# Patient Record
Sex: Female | Born: 1955 | Race: White | Hispanic: No | Marital: Single | Smoking: Never smoker
Health system: Southern US, Community
[De-identification: ages and names within clinical notes are randomized; demographics above are authoritative.]

## PROBLEM LIST (undated history)

## (undated) DIAGNOSIS — I10 Essential (primary) hypertension: Secondary | ICD-10-CM

## (undated) DIAGNOSIS — G5600 Carpal tunnel syndrome, unspecified upper limb: Secondary | ICD-10-CM

## (undated) DIAGNOSIS — Z8489 Family history of other specified conditions: Secondary | ICD-10-CM

## (undated) DIAGNOSIS — F32A Depression, unspecified: Secondary | ICD-10-CM

## (undated) DIAGNOSIS — F419 Anxiety disorder, unspecified: Secondary | ICD-10-CM

## (undated) DIAGNOSIS — Z9889 Other specified postprocedural states: Secondary | ICD-10-CM

## (undated) DIAGNOSIS — C73 Malignant neoplasm of thyroid gland: Secondary | ICD-10-CM

## (undated) DIAGNOSIS — M1990 Unspecified osteoarthritis, unspecified site: Secondary | ICD-10-CM

## (undated) DIAGNOSIS — F329 Major depressive disorder, single episode, unspecified: Secondary | ICD-10-CM

## (undated) DIAGNOSIS — I219 Acute myocardial infarction, unspecified: Secondary | ICD-10-CM

## (undated) DIAGNOSIS — T4145XA Adverse effect of unspecified anesthetic, initial encounter: Secondary | ICD-10-CM

## (undated) DIAGNOSIS — I2510 Atherosclerotic heart disease of native coronary artery without angina pectoris: Secondary | ICD-10-CM

## (undated) DIAGNOSIS — R350 Frequency of micturition: Secondary | ICD-10-CM

## (undated) DIAGNOSIS — E785 Hyperlipidemia, unspecified: Secondary | ICD-10-CM

## (undated) DIAGNOSIS — R112 Nausea with vomiting, unspecified: Secondary | ICD-10-CM

## (undated) DIAGNOSIS — T8859XA Other complications of anesthesia, initial encounter: Secondary | ICD-10-CM

## (undated) DIAGNOSIS — E039 Hypothyroidism, unspecified: Secondary | ICD-10-CM

## (undated) DIAGNOSIS — K219 Gastro-esophageal reflux disease without esophagitis: Secondary | ICD-10-CM

## (undated) DIAGNOSIS — E119 Type 2 diabetes mellitus without complications: Secondary | ICD-10-CM

## (undated) HISTORY — PX: TOTAL ABDOMINAL HYSTERECTOMY: SHX209

## (undated) HISTORY — PX: JOINT REPLACEMENT: SHX530

## (undated) HISTORY — DX: Acute myocardial infarction, unspecified: I21.9

## (undated) HISTORY — DX: Carpal tunnel syndrome, unspecified upper limb: G56.00

## (undated) HISTORY — DX: Major depressive disorder, single episode, unspecified: F32.9

## (undated) HISTORY — DX: Depression, unspecified: F32.A

## (undated) HISTORY — DX: Frequency of micturition: R35.0

## (undated) HISTORY — DX: Essential (primary) hypertension: I10

## (undated) HISTORY — PX: APPENDECTOMY: SHX54

## (undated) HISTORY — DX: Hypothyroidism, unspecified: E03.9

## (undated) HISTORY — DX: Malignant neoplasm of thyroid gland: C73

## (undated) HISTORY — PX: OTHER SURGICAL HISTORY: SHX169

## (undated) HISTORY — DX: Hyperlipidemia, unspecified: E78.5

## (undated) HISTORY — PX: COLONOSCOPY: SHX174

## (undated) HISTORY — DX: Anxiety disorder, unspecified: F41.9

## (undated) HISTORY — PX: LAPAROSCOPIC APPENDECTOMY: SUR753

---

## 2001-03-18 ENCOUNTER — Encounter: Admission: RE | Admit: 2001-03-18 | Discharge: 2001-06-16 | Payer: Self-pay | Admitting: Internal Medicine

## 2001-09-21 ENCOUNTER — Encounter: Payer: Self-pay | Admitting: Internal Medicine

## 2001-09-21 ENCOUNTER — Ambulatory Visit (HOSPITAL_COMMUNITY): Admission: RE | Admit: 2001-09-21 | Discharge: 2001-09-21 | Payer: Self-pay | Admitting: Internal Medicine

## 2001-11-23 ENCOUNTER — Other Ambulatory Visit: Admission: RE | Admit: 2001-11-23 | Discharge: 2001-11-23 | Payer: Self-pay | Admitting: Internal Medicine

## 2002-10-11 ENCOUNTER — Encounter: Payer: Self-pay | Admitting: Internal Medicine

## 2002-10-11 ENCOUNTER — Ambulatory Visit (HOSPITAL_COMMUNITY): Admission: RE | Admit: 2002-10-11 | Discharge: 2002-10-11 | Payer: Self-pay | Admitting: Internal Medicine

## 2003-01-30 ENCOUNTER — Encounter: Admission: RE | Admit: 2003-01-30 | Discharge: 2003-04-30 | Payer: Self-pay | Admitting: Family Medicine

## 2003-05-30 ENCOUNTER — Encounter: Admission: RE | Admit: 2003-05-30 | Discharge: 2003-08-28 | Payer: Self-pay | Admitting: Family Medicine

## 2003-06-30 ENCOUNTER — Ambulatory Visit (HOSPITAL_COMMUNITY): Admission: RE | Admit: 2003-06-30 | Discharge: 2003-06-30 | Payer: Self-pay | Admitting: Family Medicine

## 2003-06-30 ENCOUNTER — Encounter: Payer: Self-pay | Admitting: Family Medicine

## 2003-07-18 ENCOUNTER — Inpatient Hospital Stay (HOSPITAL_COMMUNITY): Admission: RE | Admit: 2003-07-18 | Discharge: 2003-07-20 | Payer: Self-pay | Admitting: Obstetrics and Gynecology

## 2003-07-18 ENCOUNTER — Encounter (INDEPENDENT_AMBULATORY_CARE_PROVIDER_SITE_OTHER): Payer: Self-pay | Admitting: *Deleted

## 2003-10-25 ENCOUNTER — Ambulatory Visit (HOSPITAL_COMMUNITY): Admission: RE | Admit: 2003-10-25 | Discharge: 2003-10-25 | Payer: Self-pay | Admitting: Family Medicine

## 2004-10-25 ENCOUNTER — Encounter: Admission: RE | Admit: 2004-10-25 | Discharge: 2004-10-25 | Payer: Self-pay | Admitting: Family Medicine

## 2004-11-14 ENCOUNTER — Ambulatory Visit (HOSPITAL_COMMUNITY): Admission: RE | Admit: 2004-11-14 | Discharge: 2004-11-14 | Payer: Self-pay | Admitting: Family Medicine

## 2005-01-09 ENCOUNTER — Observation Stay (HOSPITAL_COMMUNITY): Admission: EM | Admit: 2005-01-09 | Discharge: 2005-01-10 | Payer: Self-pay | Admitting: Emergency Medicine

## 2005-01-09 ENCOUNTER — Encounter (INDEPENDENT_AMBULATORY_CARE_PROVIDER_SITE_OTHER): Payer: Self-pay | Admitting: Specialist

## 2005-03-14 ENCOUNTER — Encounter: Admission: RE | Admit: 2005-03-14 | Discharge: 2005-03-14 | Payer: Self-pay | Admitting: Family Medicine

## 2005-04-21 ENCOUNTER — Emergency Department (HOSPITAL_COMMUNITY): Admission: EM | Admit: 2005-04-21 | Discharge: 2005-04-21 | Payer: Self-pay | Admitting: Emergency Medicine

## 2005-07-16 ENCOUNTER — Encounter: Admission: RE | Admit: 2005-07-16 | Discharge: 2005-07-16 | Payer: Self-pay | Admitting: Family Medicine

## 2005-07-17 ENCOUNTER — Encounter: Admission: RE | Admit: 2005-07-17 | Discharge: 2005-07-17 | Payer: Self-pay | Admitting: Family Medicine

## 2005-07-22 ENCOUNTER — Ambulatory Visit (HOSPITAL_COMMUNITY): Admission: RE | Admit: 2005-07-22 | Discharge: 2005-07-22 | Payer: Self-pay | Admitting: Orthopaedic Surgery

## 2005-12-11 ENCOUNTER — Ambulatory Visit (HOSPITAL_COMMUNITY): Admission: RE | Admit: 2005-12-11 | Discharge: 2005-12-11 | Payer: Self-pay | Admitting: Family Medicine

## 2006-12-22 ENCOUNTER — Ambulatory Visit (HOSPITAL_COMMUNITY): Admission: RE | Admit: 2006-12-22 | Discharge: 2006-12-22 | Payer: Self-pay | Admitting: Family Medicine

## 2007-01-06 ENCOUNTER — Encounter: Admission: RE | Admit: 2007-01-06 | Discharge: 2007-01-06 | Payer: Self-pay | Admitting: Family Medicine
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
## 2007-04-22 ENCOUNTER — Ambulatory Visit (HOSPITAL_COMMUNITY): Admission: RE | Admit: 2007-04-22 | Discharge: 2007-04-23 | Payer: Self-pay | Admitting: General Surgery

## 2007-04-22 ENCOUNTER — Encounter (INDEPENDENT_AMBULATORY_CARE_PROVIDER_SITE_OTHER): Payer: Self-pay | Admitting: General Surgery

## 2007-04-22 HISTORY — PX: LAPAROSCOPIC CHOLECYSTECTOMY: SUR755

## 2008-04-04 ENCOUNTER — Encounter: Admission: RE | Admit: 2008-04-04 | Discharge: 2008-04-04 | Payer: Self-pay | Admitting: Family Medicine

## 2009-09-30 ENCOUNTER — Observation Stay (HOSPITAL_COMMUNITY): Admission: EM | Admit: 2009-09-30 | Discharge: 2009-10-02 | Payer: Self-pay | Admitting: Emergency Medicine

## 2009-10-01 ENCOUNTER — Encounter (INDEPENDENT_AMBULATORY_CARE_PROVIDER_SITE_OTHER): Payer: Self-pay | Admitting: Internal Medicine

## 2009-12-25 ENCOUNTER — Encounter: Admission: RE | Admit: 2009-12-25 | Discharge: 2009-12-25 | Payer: Self-pay | Admitting: Family Medicine

## 2010-02-08 ENCOUNTER — Encounter (INDEPENDENT_AMBULATORY_CARE_PROVIDER_SITE_OTHER): Payer: Self-pay | Admitting: General Surgery

## 2010-02-08 ENCOUNTER — Observation Stay (HOSPITAL_COMMUNITY): Admission: RE | Admit: 2010-02-08 | Discharge: 2010-02-09 | Payer: Self-pay | Admitting: General Surgery

## 2010-03-12 ENCOUNTER — Encounter (HOSPITAL_COMMUNITY): Admission: RE | Admit: 2010-03-12 | Discharge: 2010-05-10 | Payer: Self-pay | Admitting: Endocrinology

## 2010-04-02 ENCOUNTER — Encounter: Admission: RE | Admit: 2010-04-02 | Discharge: 2010-04-02 | Payer: Self-pay | Admitting: Endocrinology

## 2010-04-17 ENCOUNTER — Encounter: Admission: RE | Admit: 2010-04-17 | Discharge: 2010-04-17 | Payer: Self-pay | Admitting: Family Medicine

## 2010-07-08 ENCOUNTER — Encounter: Admission: RE | Admit: 2010-07-08 | Discharge: 2010-07-08 | Payer: Self-pay | Admitting: Family Medicine

## 2010-12-01 ENCOUNTER — Encounter: Payer: Self-pay | Admitting: Family Medicine

## 2010-12-01 ENCOUNTER — Encounter: Payer: Self-pay | Admitting: General Surgery

## 2011-01-25 ENCOUNTER — Emergency Department (HOSPITAL_COMMUNITY)
Admission: EM | Admit: 2011-01-25 | Discharge: 2011-01-25 | Disposition: A | Discharge status: Home / Self Care | Payer: BC Managed Care – PPO | Attending: Emergency Medicine | Admitting: Emergency Medicine

## 2011-01-25 ENCOUNTER — Emergency Department (HOSPITAL_COMMUNITY): Payer: BC Managed Care – PPO

## 2011-01-25 DIAGNOSIS — K219 Gastro-esophageal reflux disease without esophagitis: Secondary | ICD-10-CM | POA: Insufficient documentation

## 2011-01-25 DIAGNOSIS — F341 Dysthymic disorder: Secondary | ICD-10-CM | POA: Insufficient documentation

## 2011-01-25 DIAGNOSIS — E785 Hyperlipidemia, unspecified: Secondary | ICD-10-CM | POA: Insufficient documentation

## 2011-01-25 DIAGNOSIS — Z79899 Other long term (current) drug therapy: Secondary | ICD-10-CM | POA: Insufficient documentation

## 2011-01-25 DIAGNOSIS — E119 Type 2 diabetes mellitus without complications: Secondary | ICD-10-CM | POA: Insufficient documentation

## 2011-01-25 DIAGNOSIS — I10 Essential (primary) hypertension: Secondary | ICD-10-CM | POA: Insufficient documentation

## 2011-01-25 DIAGNOSIS — E039 Hypothyroidism, unspecified: Secondary | ICD-10-CM | POA: Insufficient documentation

## 2011-01-25 DIAGNOSIS — R079 Chest pain, unspecified: Secondary | ICD-10-CM | POA: Insufficient documentation

## 2011-01-25 LAB — POCT CARDIAC MARKERS: Troponin i, poc: <0.05 ng/mL (ref 0.00–0.09)

## 2011-01-25 LAB — POCT I-STAT, CHEM 8
BUN: 15 mg/dL (ref 6–23)
Potassium: 4.1 mEq/L (ref 3.5–5.1)

## 2011-01-29 LAB — GLUCOSE, CAPILLARY
Glucose-Capillary: 159 mg/dL — ABNORMAL HIGH (ref 70–99)
Glucose-Capillary: 166 mg/dL — ABNORMAL HIGH (ref 70–99)

## 2011-02-02 LAB — BASIC METABOLIC PANEL
BUN: 21 mg/dL (ref 6–23)
CO2: 31 mEq/L (ref 19–32)
Calcium: 9.8 mg/dL (ref 8.4–10.5)
Creatinine, Ser: 0.81 mg/dL (ref 0.4–1.2)
Glucose, Bld: 92 mg/dL (ref 70–99)
Sodium: 136 mEq/L (ref 135–145)

## 2011-02-02 LAB — DIFFERENTIAL
Basophils Absolute: 0.1 10*3/uL (ref 0.0–0.1)
Basophils Relative: 1 % (ref 0–1)
Eosinophils Relative: 6 % — ABNORMAL HIGH (ref 0–5)
Monocytes Absolute: 0.5 10*3/uL (ref 0.1–1.0)
Neutro Abs: 3.1 10*3/uL (ref 1.7–7.7)

## 2011-02-02 LAB — CBC
Hemoglobin: 14.6 g/dL (ref 12.0–15.0)
MCHC: 34.6 g/dL (ref 30.0–36.0)
RDW: 12.4 % (ref 11.5–15.5)

## 2011-02-04 ENCOUNTER — Other Ambulatory Visit (HOSPITAL_COMMUNITY): Payer: Self-pay | Admitting: Endocrinology

## 2011-02-04 DIAGNOSIS — C73 Malignant neoplasm of thyroid gland: Secondary | ICD-10-CM

## 2011-02-12 LAB — BASIC METABOLIC PANEL
CO2: 27 mEq/L (ref 19–32)
Chloride: 102 mEq/L (ref 96–112)
Chloride: 109 mEq/L (ref 96–112)
GFR calc Af Amer: >60 mL/min (ref >60)
GFR calc Af Amer: >60 mL/min (ref >60)
Potassium: 4.2 mEq/L (ref 3.5–5.1)
Sodium: 137 mEq/L (ref 135–145)

## 2011-02-12 LAB — CBC
HCT: 42.3 % (ref 36.0–46.0)
Hemoglobin: 14.5 g/dL (ref 12.0–15.0)
MCHC: 34.2 g/dL (ref 30.0–36.0)
MCHC: 34.7 g/dL (ref 30.0–36.0)
MCV: 90.3 fL (ref 78.0–100.0)
MCV: 90.5 fL (ref 78.0–100.0)
Platelets: 373 10*3/uL (ref 150–400)
RBC: 4.42 MIL/uL (ref 3.87–5.11)
RBC: 4.67 MIL/uL (ref 3.87–5.11)
RBC: 4.7 MIL/uL (ref 3.87–5.11)
WBC: 7.4 10*3/uL (ref 4.0–10.5)
WBC: 8.7 10*3/uL (ref 4.0–10.5)

## 2011-02-12 LAB — LIPID PANEL
HDL: 46 mg/dL (ref >39)
Total CHOL/HDL Ratio: 4.7 RATIO
Triglycerides: 345 mg/dL — ABNORMAL HIGH (ref <150)
VLDL: 69 mg/dL — ABNORMAL HIGH (ref 0–40)

## 2011-02-12 LAB — URINALYSIS, ROUTINE W REFLEX MICROSCOPIC
Glucose, UA: NEGATIVE mg/dL
Hgb urine dipstick: NEGATIVE
Ketones, ur: NEGATIVE mg/dL
Protein, ur: NEGATIVE mg/dL

## 2011-02-12 LAB — PROTIME-INR
INR: 0.93 (ref 0.00–1.49)
Prothrombin Time: 12.4 seconds (ref 11.6–15.2)

## 2011-02-12 LAB — CK TOTAL AND CKMB (NOT AT ARMC)
CK, MB: 3 ng/mL (ref 0.3–4.0)
CK, MB: 3.5 ng/mL (ref 0.3–4.0)
Relative Index: 1.7 (ref 0.0–2.5)
Relative Index: 2 (ref 0.0–2.5)

## 2011-02-12 LAB — DIFFERENTIAL
Basophils Absolute: 0.1 10*3/uL (ref 0.0–0.1)
Lymphocytes Relative: 29 % (ref 12–46)
Monocytes Absolute: 1 10*3/uL (ref 0.1–1.0)
Monocytes Relative: 11 % (ref 3–12)
Neutro Abs: 5.6 10*3/uL (ref 1.7–7.7)

## 2011-02-12 LAB — COMPREHENSIVE METABOLIC PANEL
ALT: 43 U/L — ABNORMAL HIGH (ref 0–35)
AST: 39 U/L — ABNORMAL HIGH (ref 0–37)
Albumin: 4.1 g/dL (ref 3.5–5.2)
CO2: 25 mEq/L (ref 19–32)
Calcium: 9 mg/dL (ref 8.4–10.5)
GFR calc Af Amer: >60 mL/min (ref >60)
GFR calc non Af Amer: >60 mL/min (ref >60)
Sodium: 138 mEq/L (ref 135–145)
Total Protein: 6.7 g/dL (ref 6.0–8.3)

## 2011-02-12 LAB — GLUCOSE, CAPILLARY: Glucose-Capillary: 102 mg/dL — ABNORMAL HIGH (ref 70–99)

## 2011-02-12 LAB — MAGNESIUM: Magnesium: 2.3 mg/dL (ref 1.5–2.5)

## 2011-02-24 ENCOUNTER — Encounter (HOSPITAL_COMMUNITY)
Admission: RE | Admit: 2011-02-24 | Discharge: 2011-02-24 | Disposition: A | Discharge status: Home / Self Care | Payer: BC Managed Care – PPO | Source: Ambulatory Visit | Attending: Endocrinology | Admitting: Endocrinology

## 2011-02-24 DIAGNOSIS — Z09 Encounter for follow-up examination after completed treatment for conditions other than malignant neoplasm: Secondary | ICD-10-CM | POA: Insufficient documentation

## 2011-02-24 DIAGNOSIS — C73 Malignant neoplasm of thyroid gland: Secondary | ICD-10-CM | POA: Insufficient documentation

## 2011-02-25 ENCOUNTER — Encounter (HOSPITAL_COMMUNITY)
Admission: RE | Admit: 2011-02-25 | Discharge: 2011-02-25 | Disposition: A | Discharge status: Home / Self Care | Payer: BC Managed Care – PPO | Source: Ambulatory Visit | Attending: Endocrinology | Admitting: Endocrinology

## 2011-02-26 ENCOUNTER — Encounter (HOSPITAL_COMMUNITY)
Admission: RE | Admit: 2011-02-26 | Discharge: 2011-02-26 | Disposition: A | Discharge status: Home / Self Care | Payer: BC Managed Care – PPO | Source: Ambulatory Visit | Attending: Endocrinology | Admitting: Endocrinology

## 2011-02-28 ENCOUNTER — Ambulatory Visit (HOSPITAL_COMMUNITY)
Admission: RE | Admit: 2011-02-28 | Discharge: 2011-02-28 | Disposition: A | Discharge status: Home / Self Care | Payer: BC Managed Care – PPO | Source: Ambulatory Visit | Attending: Endocrinology | Admitting: Endocrinology

## 2011-02-28 DIAGNOSIS — C73 Malignant neoplasm of thyroid gland: Secondary | ICD-10-CM | POA: Insufficient documentation

## 2011-02-28 DIAGNOSIS — Z09 Encounter for follow-up examination after completed treatment for conditions other than malignant neoplasm: Secondary | ICD-10-CM | POA: Insufficient documentation

## 2011-02-28 MED ORDER — SODIUM IODIDE I 131 CAPSULE
4.0000 | Freq: Once | INTRAVENOUS | Status: AC | PRN
  Administered 2011-02-28: 4 via ORAL

## 2011-03-25 NOTE — Op Note (Signed)
Natalie, Hensley                ACCOUNT NO.:  [id number]   MEDICAL RECORD NO.:  [id number]          PATIENT TYPE:  OIB   LOCATION:  5705                         FACILITY:  [facility]   PHYSICIAN:  Sharlet Salina T. Hoxworth, M.D.DATE OF BIRTH:  [birth year]   DATE OF PROCEDURE:  04/22/2007  DATE OF DISCHARGE:  04/23/2007                               OPERATIVE REPORT   POSTOPERATIVE DIAGNOSIS:  Cholelithiasis and cholecystitis.   SURGICAL PROCEDURES:  Laparoscopic cholecystectomy with intraoperative  cholangiogram.   SURGEON:  Sharlet Salina T. Hoxworth, M.D.   ANESTHESIA:  General.   BRIEF HISTORY:  Natalie Hensley is a 55 year old female with daily  persistent episodic epigastric and right upper quadrant abdominal pain.  Workup has included a gallbladder ultrasound showing a large gallstone.  She is felt to have symptomatic cholelithiasis and cholecystitis.  Laparoscopic cholecystectomy with cholangiogram has been  recommended  and accepted.  The nature of procedure, its indications and risks of  bleeding, infection, bile leak and bile duct injury have been discussed  understood.  She is now brought to operating room for this procedure.   DESCRIPTION OF OPERATION:  The patient brought up and placed in supine  position on the table and general orotracheal anesthesia was induced.  The abdomen was widely sterilely prepped and draped.  Correct patient  procedure were verified.  PAS were place.  Local anesthesia was used  infiltrate the trocar sites.  A 1 cm incision was made at the umbilicus  and dissection carried down to midline fascia which was sharply incised  for 1 cm, the peritoneum entered under direct vision.  Through mattress  suture of 0-0 Vicryl the Hasson trocar was placed and pneumoperitoneum  established.  Under direct vision a standard four-port technique was  used.  There were fairly extensive omental adhesions up to the  gallbladder.  It was not acutely inflamed.  These adhesions  were taken  down with careful cautery and blunt dissection.  There was significant  adhesions of the duodenum up to the infundibulum as well but these were  filmy and taken down, staying away from the duodenum.  The infundibulum  was then retracted inferolaterally.  Peritoneum anterior and posterior  to Calot's triangle was incised.  Fibrofatty tissue was stripped down  off the neck of the gallbladder toward the porta hepatis.  The distal  gallbladder in Calot's triangle was thoroughly dissected.  The cystic  artery clearly identified coursing up on the gallbladder wall.  The  cystic duct was identified and dissected free and the cystic duct  gallbladder junction dissected 360 degrees.  When the anatomy was clear,  the cystic artery was doubly clipped proximally, clipped distally and  the cystic duct was clipped to the gallbladder junction.  Operative  cholangiogram was then obtained through the cystic duct which showed  good filling of normal common bile duct and intrahepatic ducts with free  flow into the duodenum and no filling defects.  Following this, the  cholangiocath was removed.  The cystic duct was doubly clipped  proximally and divided.  Cystic artery was divided.  A gallbladder was  then dissected free from its bed using hook cautery and removed intact  through the umbilicus.  Complete hemostasis was obtained in the  gallbladder bed.  The right upper quadrant was  thoroughly irrigated and suctioned.  Trocars removed with direct vision  and all CO2 evacuated.  The mattress suture was secured at the  umbilicus.  Skin incisions were closed with subcuticular 4-0 Monocryl  and Dermabond.  Sponge, needle and instrument counts were correct.  The  patient taken recovery in good condition.      Lorne Skeens. Hoxworth, M.D.  Electronically Signed     BTH/MEDQ  D:  04/22/2007  T:  04/23/2007  Job:  [id number]

## 2011-03-28 NOTE — H&P (Signed)
NAMEHERO, MCCATHERN                ACCOUNT NO.:  [id number]   MEDICAL RECORD NO.:  [id number]          PATIENT TYPE:  INP   LOCATION:  0102                         FACILITY:  [facility]   PHYSICIAN:  Anselm Pancoast. Weatherly, M.D.DATE OF BIRTH:  Jan 28, 1956   DATE OF ADMISSION:  01/09/2005  DATE OF DISCHARGE:                                HISTORY & PHYSICAL   CHIEF COMPLAINT:  Abdominal pain, shifting to the right lower quadrant.   HISTORY:  Kindred Heying is a 55 year old Caucasian female who was referred to  the ER to see me by the [facility], where she had  presented this morning with the following history:  She said yesterday, she  felt gaseous, bloated.  The pain was kind of more in the upper abdomen.  Then early this morning, shifted to the right upper quadrant.  She was seen  in the [employer].  White count was 14,000 with definite  tenderness in her lower abdomen.  Referred over for a CT at [facility]  that showed findings of early acute appendicitis.   The patient has had a previous hysterectomy and a bilateral salpingo-  oophorectomy and otherwise is in good health.   She is on several medications, one for blood pressure, Cardizem 240 XL.  She  is also on Zocor, Laroxyl, Prilosec, Ecotrin, and vitamins.   Denies allergies.   PHYSICAL EXAMINATION:  VITAL SIGNS:  Temperature 97.6, blood pressure  119/77, pulse 95, respirations 20.  I am not sure where her weight is  recorded.  GENERAL:  She is a pleasant, slightly overweight Caucasian female in no  acute distress.  She appears younger than her stated age.  HEENT:  Adequately hydrated.  NECK:  No cervical or supraclavicular adenopathy.  LUNGS:  Clear.  HEART:  Normal sinus rhythm.  BREASTS:  Not done.  ABDOMEN:  She is mildly bloated.  No umbilical or inguinal hernias.  Definitely mildly tender in the right lower quadrant with mild muscle  guarding.  Not tender in the upper abdomen or  left lower quadrant.  Did not  do a pelvic exam, since she has had a previous hysterectomy, and CT confirms  appendicitis.  EXTREMITIES:  Unremarkable.  CNS:  Physiologic.   IMPRESSION:  Acute appendicitis with a history of hypertension.   PLAN:  We will proceed with a laparoscopic cholecystectomy.  She will be  given 3 gm of Unasyn and hopefully will be able to be released in  approximately 24 hours.      WJW/MEDQ  D:  01/09/2005  T:  01/09/2005  Job:  [id number]

## 2011-03-28 NOTE — Op Note (Signed)
Natalie Hensley, Natalie Hensley                ACCOUNT NO.:  [id number]   MEDICAL RECORD NO.:  [id number]          PATIENT TYPE:  INP   LOCATION:  0102                         [facility]ACILITY:  [facility]   PHYSICIAN:  Anselm Pancoast. Weatherly, M.D.DATE OF BIRTH:  06/08/1956   DATE OF PROCEDURE:  01/09/2005  DATE OF DISCHARGE:                                 OPERATIVE REPORT   PREOPERATIVE DIAGNOSES:  Acute appendicitis.   POSTOPERATIVE DIAGNOSES:  Acute appendicitis.   OPERATION:  Laparoscopic appendectomy.   ANESTHESIA:  General.   SURGEON:  Anselm Pancoast. Zachery Dakins, M.D.   ASSISTANT:  Nurse.   HISTORY:  Natalie Hensley is a 55 year old Caucasian female who was referred by  the [facility] where she presented this morning with  approximately 24 hour progressive history of kind of generalized and  localized pain in the right lower quadrant. A CT was performed that  mentioned it was thought to be acute appendicitis and she had an elevated  white count of 14,000. The patient was definitely tender in the lower  abdomen and I think with her symptoms it certainly sounds like appendicitis  and the appendix was larger than thought normal but no evidence of any  periappendiceal fluid or preparation. The patient was given 3 g of Unasyn,  he has had a short delay in getting to the operating room.   The patient was positioned on the OR table, induction of general anesthesia,  the endotracheal tube, oral tube into the stomach. The abdomen was then  prepped with Betadine surgical solution and draped in a sterile manner. A  Foley catheter had been inserted and then I made a small incision below the  umbilicus, the fascia was identified and was picked up between Kocher's. A  small opening carefully made into the peritoneal cavity.  #0 Vicryl was  placed and Hasson cannula introduced.  Carbon dioxide was started, a camera  was inserted and you could see in the right upper quadrant what appeared to  be  a large early acute appendicitis.  A 5 mm trocar was placed in the right  upper quadrant and then a 10/11 placed in the left lower quadrant. She had  had a previous hysterectomy and there were some adhesions that I divided  with electrocautery exposing the appendix better. There were some adhesions  around the appendix that were separated down to the lateral pelvic wall and  then the appendiceal mesentery could be separated at the base of the  appendix. This was fired with the first vascular GIA and then the second  fire was placed around the base of the appendix and both fired with good  hemostasis. I then placed the appendix in the EndoCatch bag and switched the  camera back to the left lower quadrant and withdrew the bag containing the  appendix at the umbilicus. I then reinserted the Hasson cannula, thoroughly  irrigated, looked at all the operative areas, good hemostasis, there was a  little bit of bleeding where the adhesions had been taken down against the  right lateral pelvic wall and this was electrocoagulated.  Irrigation was  used and satisfied the irrigation fluid had been aspirated and then the  10/11 in the left lower quadrant was first removed under direct vision, 5 mm  under direct vision and then Hasson cannula. I had placed a couple of figure-  of-eights at the umbilicus of #0 Vicryl in addition to the pursestring and I  placed one  figure-of-eight into the anterior fascia with __________.  The subcutaneous  wounds were closed with 4-0 Vicryl, Benzoin and Steri-Strips were placed on  the skin.  The patient tolerated the procedure nicely and was sent to the  recovery room, extubated. We removed her Foley catheter and hopefully she  will be ready for discharge in the a.m.      WJW/MEDQ  D:  01/09/2005  T:  01/09/2005  Job:  [id number]   cc:   Quinn Plowman, PA  [facility]

## 2011-03-28 NOTE — Op Note (Signed)
NAMEVIBHA, Natalie Hensley                          ACCOUNT NO.:  [id number]   MEDICAL RECORD NO.:  [id number]                   PATIENT TYPE:  INP   LOCATION:  9198                                 FACILITY:  [facility]   PHYSICIAN:  Laqueta Linden, M.D.                 DATE OF BIRTH:  October 01, 1956   DATE OF PROCEDURE:  07/18/2003  DATE OF DISCHARGE:                                 OPERATIVE REPORT   PREOPERATIVE DIAGNOSIS:  Leiomyoma uteri.   POSTOPERATIVE DIAGNOSIS:  Leiomyoma uteri.   PROCEDURE:  Total abdominal hysterectomy with bilateral salpingo-  oophorectomy, placement of On-Q system x2.   SURGEON:  Laqueta Linden, M.D.   ASSISTANT:  Andres Ege, M.D.   ANESTHESIA:  General endotracheal.   ESTIMATED BLOOD LOSS:  100 mL.   URINE OUTPUT:  Greater than 250 mL of clear urine.   FLUIDS:  1400 mL of crystalloid.   COUNTS:  Correct x2.   COMPLICATIONS:  None.   INDICATIONS:  Natalie Hensley is a 55 year old gravida 1, para 0 single black  female presenting with perimenopausal dysfunctional bleeding and large  fibroids with pressure related symptoms including pelvic pressure, urinary  frequency, and constipation.  She was noted on ultrasound to have multiple  large fibroids, the largest measuring 10 x 9.5 cm.  She underwent  endometrial sampling revealing focal simple hyperplasia without atypia.  She  was assessed of the risks, benefits and alternatives of the procedure  including the possibility of awaiting natural menopause or shrinkage or  uterine artery embolization and desired to proceed to definitive surgical  management.  She desired removal of both ovaries and understands that she  will become immediately menopausal.  She has a Vivelle 0.05 mg patch in  place to control these symptoms postoperatively.  She and her partner have  seen the informed consent film and voice their understanding of all risks,  benefits and alternatives and complications including, but not limited  to,  anesthesia risks, infection, bleeding possibly requiring transfusion, injury  to bowel, bladder, ureters as well as vessels or nerves, possibility of  fistula formation, other postoperative complications including an increased  risk of infection or slow healing due to the patient's diet-controlled  diabetes, risks related to her hypertension, as well as risks of DVT, PE,  pneumonia, death and other unnamed risks.  She has seen the informed consent  film, voiced her understanding and acceptance of all risks and agrees to  proceed.  She is also aware that this will render her permanently and  irreversibly sterilized as well.  The patient received Ancef 1 g IV  antibiotic prophylaxis on call to the OR.   DESCRIPTION OF PROCEDURE:  The patient was taken to the operating room.  After proper identification and consent were ascertained, she was placed on  the operating table in the supine position.  After the induction of general  endotracheal anesthesia, she was placed in the frog-leg position and the  abdomen, perineum and vagina were prepped and draped in a routine sterile  fashion.  A transurethral Foley was placed.  The Pfannenstiel incision was  then made and carried down to the level of the anterior rectus fascia.  The  fascia was incised and the incision extended laterally, superiorly and  inferiorly.  The rectus muscles were separated and the parietal peritoneum  elevated and incised.  The incision was extended superiorly and inferiorly  to the level of the bladder.  Palpation of the upper abdomen revealed smooth  renal contours bilaterally, smooth liver edge with no palpable gallstones.  The appendix was normal in appearance and to palpation.  The self-retaining  retractor was placed, and the bowel packed into the upper abdomen using  moistened lap packs.  The lap pads were also used to cushion the lateral  blades of the retractor.  Inspection of the pelvis revealed a grossly   enlarged, distorted uterus with multiple large vascular-appearing fibroid  knobs protruding from all directions of the uterine fundus.  Both tubes and  ovaries appeared normal, if not somewhat atrophic in appearance.  There was  some scarring of the bladder up onto the anterior uterine surface, almost up  to the fundus.  It was felt that the patient likely had endometriosis as  well.  Several filmy adhesions were lysed, and the uterus was elevated in  the operative field.  The adnexal pedicles and cornual region were grasped  with [facility] clamps. The round ligaments were suture ligated and cut with  dissection carried forward and posteriorly in the broad ligament.  The  infundibulopelvic ligaments were then isolated bilaterally, clamped, cut and  doubly ligated with a free tie and a stitch of 0 Vicryl.  At this point, the  uterine vessels were skeletonized and curved Haney clamps placed across them  at the level of the internal os.  A pedicle was cut and suture ligated.  No  additional pedicle was taken with this ligature incorporating the previous  one such that the uterine vessels were doubly ligated.  At this point, the  very bulky uterus was transected from the upper portion of the cervix to  allow for better visualization.  The bladder flap had been advanced sharply  and bluntly and was well off of the cervix prior to this procedure.  Visualization was markedly improved once the uterine fundus and attached  fibroids were out of the operative field.  The uterine stump was then  grasped with Kocher clamps and the bladder was advanced slightly more  straight and then curved Haney clamps were placed across the remaining  pedicles with entry into the upper vaginal angle.  The pedicles were cut and  suture ligated.  The cervix was then circumscribed with a scalpel and  removed and sent in the final specimen with the detached uterine body.  The uterine angles were then plicated with Richardson  angle sutures.  The  remaining of the vaginal cuff was closed with interrupted figure-of-eight  sutures of 0 Vicryl.  Both ureters were noted to be deep in the pelvis, well  out of the operative field, of normal course and caliber, peristalsing  normally throughout the procedure.  The uterosacral tags were tied  posteriorly to prevent enterocele formation.  Several small bleeding points  were cauterized.  Copious lavage was accomplished, and hemostasis was noted  to be excellent.  All pedicles were hemostatic.  At  this point, the self-  retaining retractor and lap packs were all removed.  The parietal peritoneum  was closed in a running fashion using 2-0 Vicryl suture.  The rectus muscles  were loosely reapproximated in the midline.  Subfascial hemostasis was  ascertained.  An On-Q catheter was placed through the left mid quadrant with  the catheter in place, and the fascia was then closed from both lateral  aspects of the midline using running stitch of 0 Maxon.  This catheter was  then loaded with 10 mL of 1 mL of plain lidocaine.  Subcutaneous hemostasis  was ascertained.  A subcutaneous On-Q catheter was then placed through the  right mid quadrant and laid into the incision and the skin incision was then  closed using a 4-0 Vicryl suture on a Keith needle.  Steri-Strips were  applied to hold the catheters in place and then Tegaderm was applied.  The  abdominal incision was reinforced with Steri-Strips.  The subcutaneous  catheter was also injected with loading dose of 10 mL of 1% plain lidocaine.  Both catheters were then hooked up to the bulb infusing Marcaine 0.5% at a  rate of 2 mL per hour per catheter.  Pressure dressing was applied.  The  patient was awakened and extubated on transfer to the PACU.  Estimated blood  loss was 100 mL.  Urine output greater than 250 mL of clear urine.  Fluids  1400 mL of crystalloid.  Counts correct x2.  Complications none.                                                Laqueta Linden, M.D.    LKS/MEDQ  D:  07/18/2003  T:  07/18/2003  Job:  [id number]   cc:   Otilio Connors. Gerri Spore, M.D.  [address]  [place]  [place] [address]  Fax: [phone]

## 2012-01-19 IMAGING — US US SOFT TISSUE HEAD/NECK
1 series · 14 of 25 positions shown · non-contrast
Comparison: None.

CLINICAL DATA: Thyroid goiter on   exam.

THYROID ULTRASOUND
TECHNIQUE: Ultrasound examination of the thyroid gland and
adjacent soft tissues was performed.

[Series 1: us soft tissue head/neck · 0.09mm/px · 14 of 63 slices shown]
[im 1/63]
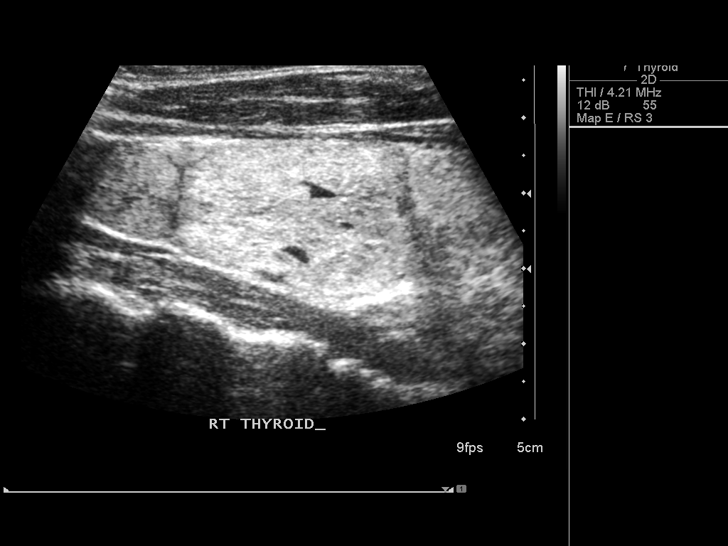
[im 6/63]
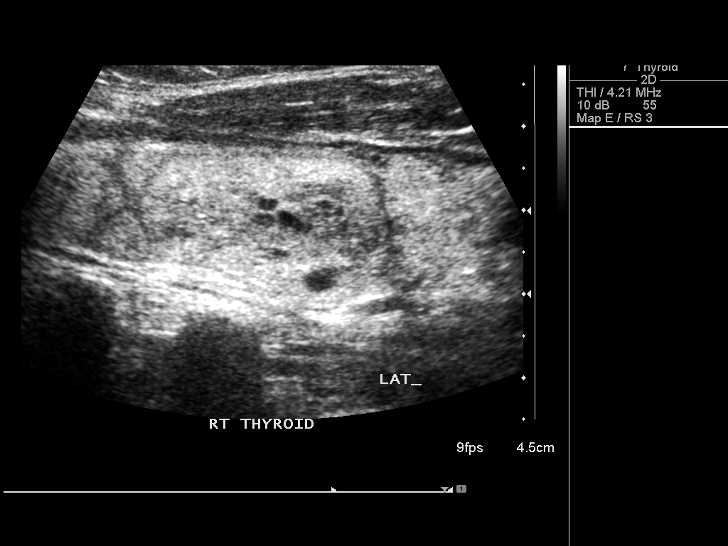
[im 11/63]
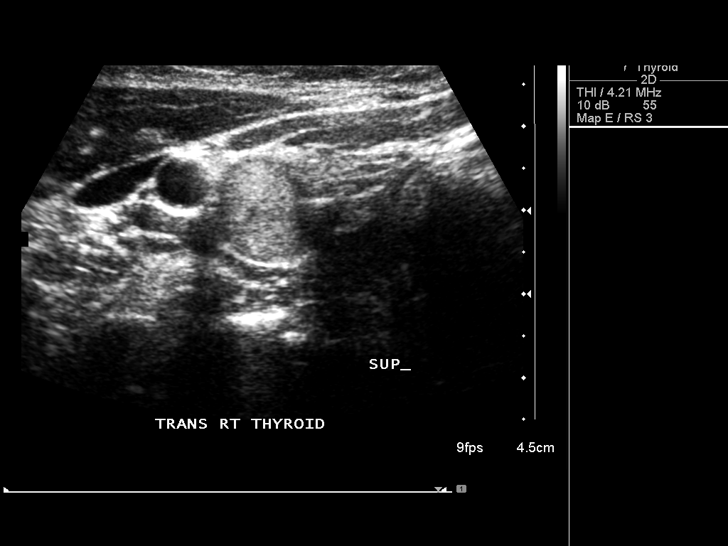
[im 16/63]
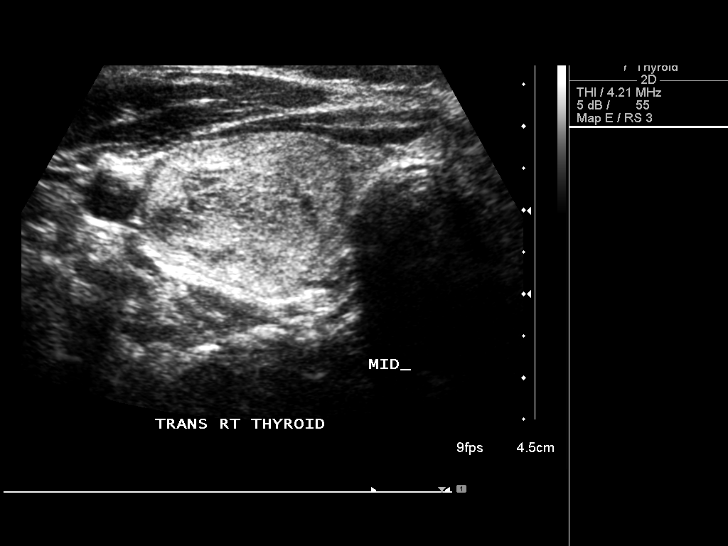
[im 21/63]
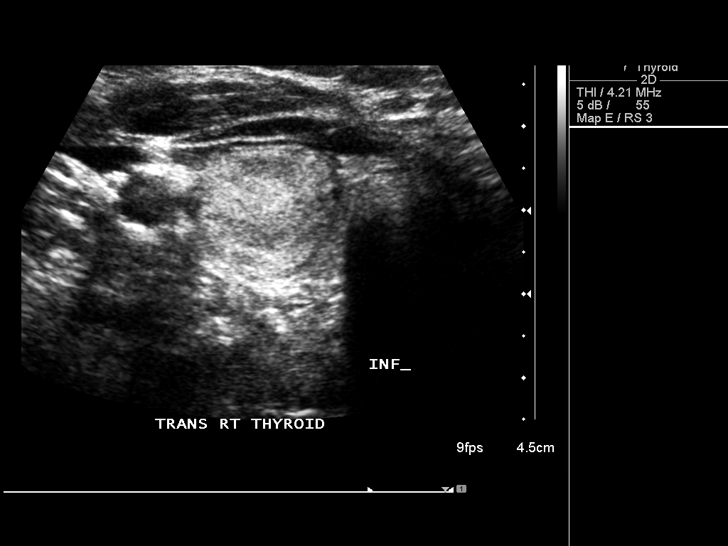
[im 24/63]
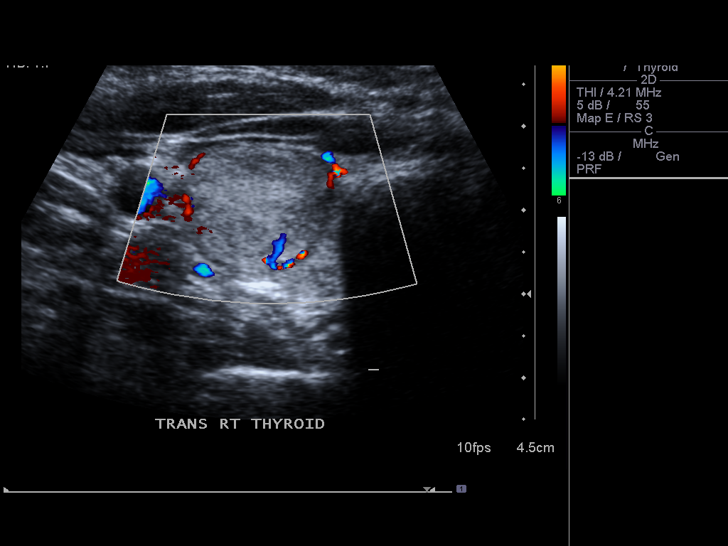
[im 29/63]
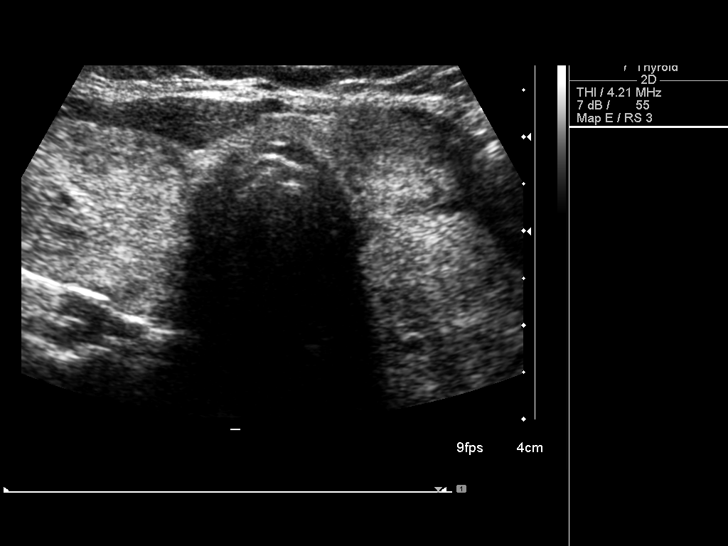
[im 34/63]
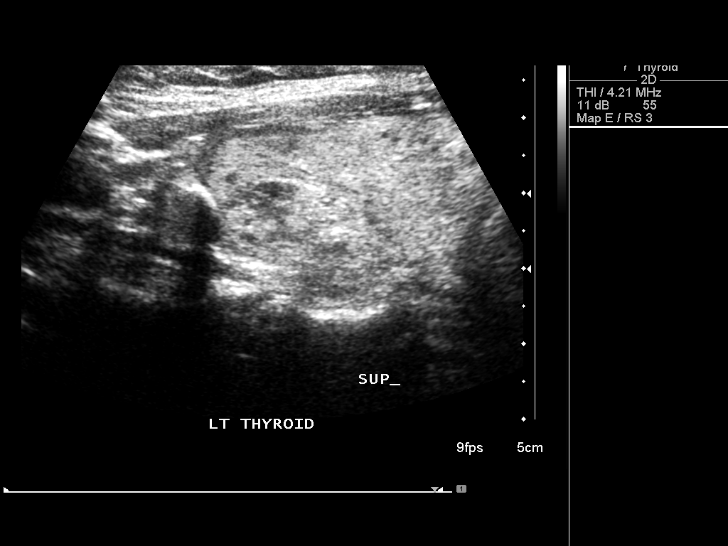
[im 39/63]
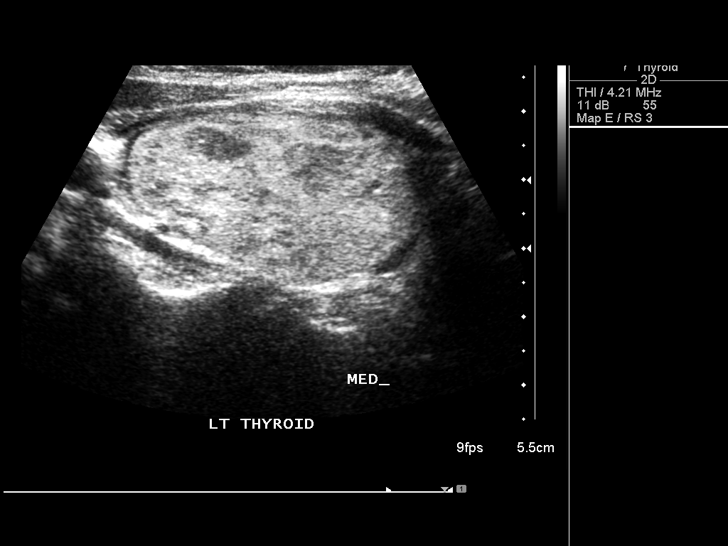
[im 42/63]
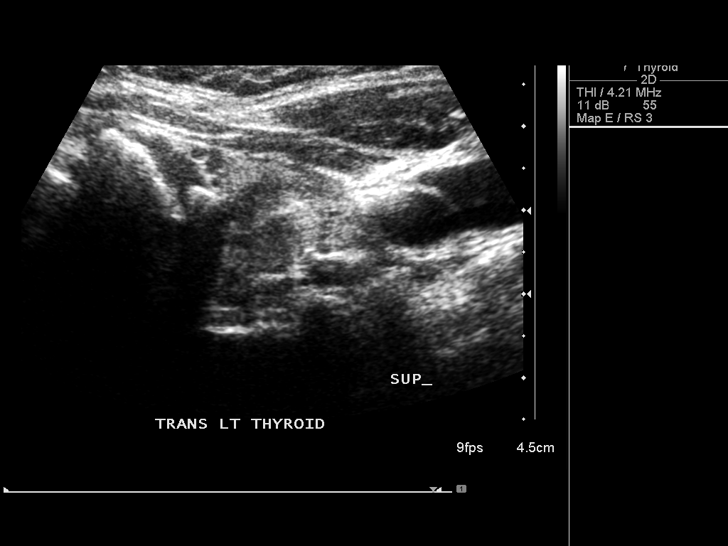
[im 47/63]
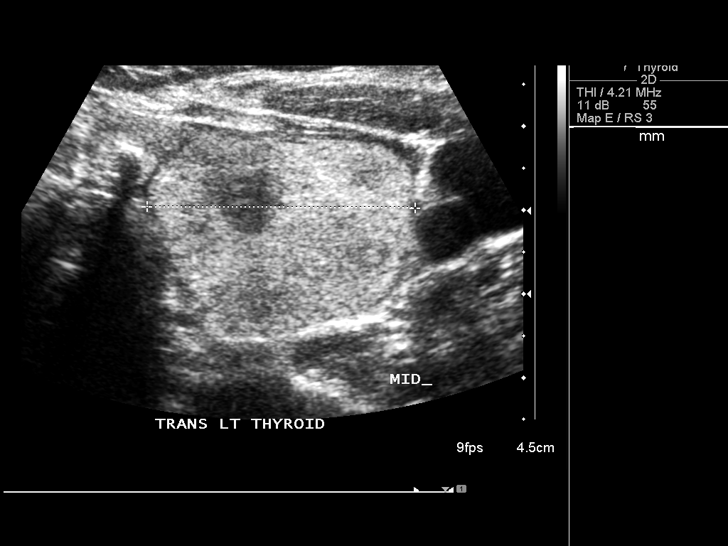
[im 52/63]
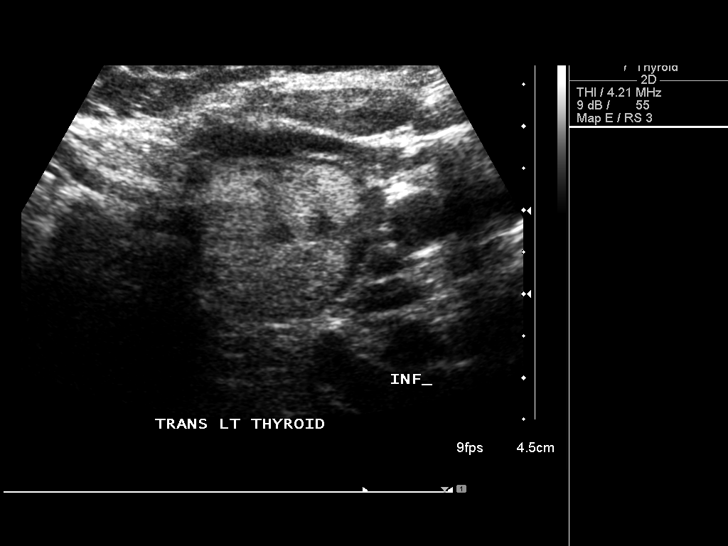
[im 57/63]
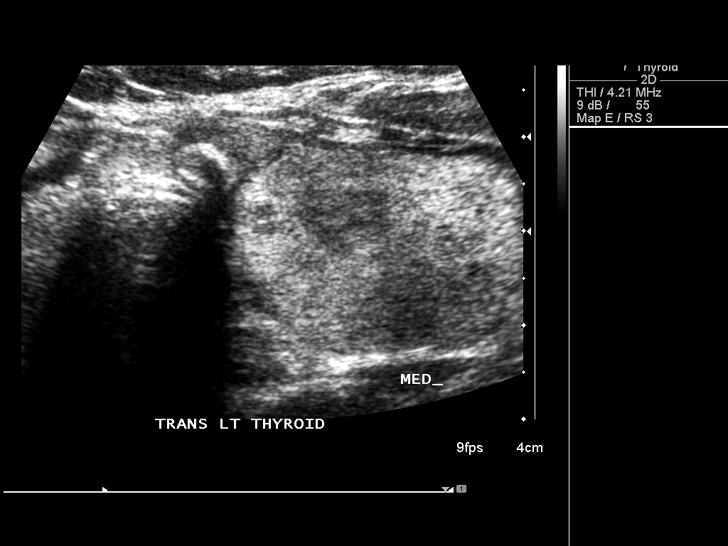
[im 63/63]
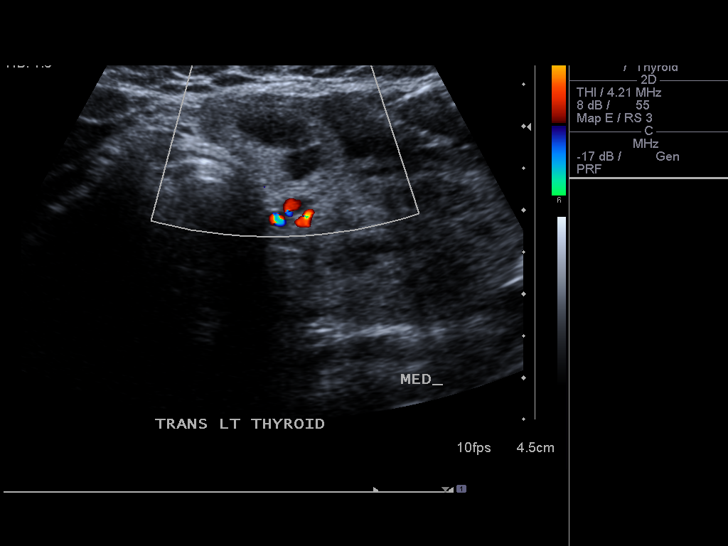

[14 of 25 positions shown; findings below may reference images not displayed]

FINDINGS: The right lobe of the thyroid measures 5.9 x 2.6 x
cm.  The echotexture is heterogeneous.  Solid nodule right upper
pole measures 1.5 x 1.3 x 1.1 cm.  Solid nodule in the right mid
lobe measures 3.3 x 1.8 x 2.6 cm.  Solid nodule left lower pole
measures a solid nodule left lower pole measures 1.9 x 1.8 x
cm.

The left lobe measures 5.3 x 2.5 x 3.7 cm.  The left lobe is
heterogeneous.  There are multiple  nodules in the left lobe.  The
largest is in the mid pole and measures 4.3 x 2.5 x 3.2 cm.  This
nodule is mildly heterogeneous without definite microcalcification
or necrosis.
IMPRESSION: Multinodular goiter.  Dominant nodules are present in the right and
left lobes.  Given the size of these nodules, tissue sampling or
close ultrasound follow-up in 6 months are suggested.  Ultrasound
guided biopsy could be performed   for tissue sampling.

## 2012-03-18 ENCOUNTER — Other Ambulatory Visit: Payer: Self-pay | Admitting: Gastroenterology

## 2012-03-18 DIAGNOSIS — R1031 Right lower quadrant pain: Secondary | ICD-10-CM

## 2012-03-22 ENCOUNTER — Ambulatory Visit
Admission: RE | Admit: 2012-03-22 | Discharge: 2012-03-22 | Disposition: A | Discharge status: Home / Self Care | Payer: No Typology Code available for payment source | Source: Ambulatory Visit | Attending: Gastroenterology | Admitting: Gastroenterology

## 2012-03-22 DIAGNOSIS — R1031 Right lower quadrant pain: Secondary | ICD-10-CM

## 2012-03-22 MED ORDER — IOHEXOL 300 MG/ML  SOLN
125.0000 mL | Freq: Once | INTRAMUSCULAR | Status: AC | PRN
  Administered 2012-03-22: 125 mL via INTRAVENOUS

## 2012-03-23 ENCOUNTER — Other Ambulatory Visit: Payer: BC Managed Care – PPO

## 2012-03-24 ENCOUNTER — Other Ambulatory Visit: Payer: Self-pay | Admitting: Gastroenterology

## 2013-07-27 HISTORY — PX: CORONARY ARTERY BYPASS GRAFT: SHX141

## 2013-08-30 ENCOUNTER — Encounter: Payer: Self-pay | Admitting: Cardiovascular Disease

## 2013-08-30 ENCOUNTER — Encounter: Payer: Self-pay | Admitting: *Deleted

## 2013-08-30 DIAGNOSIS — I219 Acute myocardial infarction, unspecified: Secondary | ICD-10-CM | POA: Insufficient documentation

## 2013-08-30 DIAGNOSIS — E119 Type 2 diabetes mellitus without complications: Secondary | ICD-10-CM | POA: Insufficient documentation

## 2013-08-30 DIAGNOSIS — I10 Essential (primary) hypertension: Secondary | ICD-10-CM | POA: Insufficient documentation

## 2013-08-30 DIAGNOSIS — F329 Major depressive disorder, single episode, unspecified: Secondary | ICD-10-CM | POA: Insufficient documentation

## 2013-08-30 DIAGNOSIS — F419 Anxiety disorder, unspecified: Secondary | ICD-10-CM | POA: Insufficient documentation

## 2013-08-30 DIAGNOSIS — R350 Frequency of micturition: Secondary | ICD-10-CM | POA: Insufficient documentation

## 2013-08-30 DIAGNOSIS — E039 Hypothyroidism, unspecified: Secondary | ICD-10-CM | POA: Insufficient documentation

## 2013-08-30 DIAGNOSIS — C73 Malignant neoplasm of thyroid gland: Secondary | ICD-10-CM | POA: Insufficient documentation

## 2013-08-30 DIAGNOSIS — E049 Nontoxic goiter, unspecified: Secondary | ICD-10-CM | POA: Insufficient documentation

## 2013-08-30 DIAGNOSIS — F32A Depression, unspecified: Secondary | ICD-10-CM | POA: Insufficient documentation

## 2013-08-30 DIAGNOSIS — G5600 Carpal tunnel syndrome, unspecified upper limb: Secondary | ICD-10-CM | POA: Insufficient documentation

## 2013-08-30 DIAGNOSIS — E785 Hyperlipidemia, unspecified: Secondary | ICD-10-CM | POA: Insufficient documentation

## 2013-08-31 ENCOUNTER — Ambulatory Visit (INDEPENDENT_AMBULATORY_CARE_PROVIDER_SITE_OTHER): Payer: Federal, State, Local not specified - PPO | Admitting: Cardiovascular Disease

## 2013-08-31 ENCOUNTER — Encounter (INDEPENDENT_AMBULATORY_CARE_PROVIDER_SITE_OTHER): Payer: Self-pay

## 2013-08-31 ENCOUNTER — Encounter: Payer: Self-pay | Admitting: Cardiovascular Disease

## 2013-08-31 VITALS — BP 126/83 | HR 90 | Ht 68.0 in | Wt 172.4 lb

## 2013-08-31 DIAGNOSIS — I219 Acute myocardial infarction, unspecified: Secondary | ICD-10-CM

## 2013-08-31 DIAGNOSIS — E119 Type 2 diabetes mellitus without complications: Secondary | ICD-10-CM

## 2013-08-31 DIAGNOSIS — I10 Essential (primary) hypertension: Secondary | ICD-10-CM

## 2013-08-31 DIAGNOSIS — E785 Hyperlipidemia, unspecified: Secondary | ICD-10-CM

## 2013-08-31 MED ORDER — METOPROLOL TARTRATE 25 MG PO TABS: ORAL_TABLET | ORAL | Status: DC

## 2013-08-31 NOTE — Assessment & Plan Note (Signed)
Well controlled.  Continue current medications and low sodium Dash type diet.    

## 2013-08-31 NOTE — Assessment & Plan Note (Signed)
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  Continue ASA and statin Not clear why she is on plavix Can likely stop next visit

## 2013-08-31 NOTE — Patient Instructions (Signed)
Your physician recommends that you schedule a follow-up appointment in:   3 MONTHS WITH DR [facility] Your physician recommends that you continue on your current medications as directed. Please refer to the Current Medication list given to you today. Your physician recommends that you return for lab work in: LIPID LIVER    IN 3 MONTHS   FASTING

## 2013-08-31 NOTE — Assessment & Plan Note (Signed)
Discussed low carb diet.  Target hemoglobin A1c is 6.5 or less.  Continue current medications.  

## 2013-08-31 NOTE — Progress Notes (Signed)
Patient ID: Natalie Hensley, female   DOB: 02-07-1956, 57 y.o.   MRN: [id number] 57 yo previously seen by [facility].  Last seen 2 years ago.  No documented CAD.  Family history CAD and elevated lipids  5 weeks ago had SEMI while eating dinner at [facility]  Seen by [facility]  CABG 07/27/13 by Dr Iona Coach. [place] to LAD SVG to RCA and SVG to D1.  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  Post op course complicated by sternal wound infection. Had 5 days of antibiotics with wound vac. Also 10 day course of oral Levaquin Seeing surgeion in [place] tomorrow.  No fevers or recurrent drainiage. Musculoskeletal pain relieved with tylenol in chest.  Compliant with meds.    Reviewed over 50 [facility]   ROS: Denies fever, malais, weight loss, blurry vision, decreased visual acuity, cough, sputum, SOB, hemoptysis, pleuritic pain, palpitaitons, heartburn, abdominal pain, melena, lower extremity edema, claudication, or rash.  All other systems reviewed and negative   General: Affect appropriate Healthy:  appears stated age HEENT: normal Neck supple with no adenopathy JVP normal no bruits no thyromegaly Lungs clear with no wheezing and good diaphragmatic motion Heart:  S1/S2 no murmur,rub, gallop or click  Wound vac with tube in sternum and suction PMI normal Abdomen: benighn, BS positve, no tenderness, no AAA no bruit.  No HSM or HJR Distal pulses intact with no bruits No edema Neuro non-focal Skin warm and dry No muscular weakness  Medications Current Outpatient Prescriptions  Medication Sig Dispense Refill  . acetaminophen (TYLENOL) 500 MG tablet Take 500 mg by mouth every 8 (eight) hours as needed for pain.      Marland Kitchen ALPRAZolam (XANAX) 0.25 MG tablet Take 0.25 mg by mouth 3 (three) times daily as needed for sleep.      Marland Kitchen aspirin 81 MG tablet Take 81 mg by mouth daily.      . clopidogrel (PLAVIX) 75 MG tablet Take 75 mg by mouth  daily.      Marland Kitchen levofloxacin (LEVAQUIN) 750 MG tablet Take 750 mg by mouth daily.      Marland Kitchen levothyroxine (SYNTHROID, LEVOTHROID) 150 MCG tablet Take 150 mcg by mouth daily before breakfast.      . metFORMIN (GLUCOPHAGE) 500 MG tablet Take 500 mg by mouth daily with breakfast.      . metoprolol (LOPRESSOR) 25 MG tablet 1/2 tab  Bid      . omeprazole (PRILOSEC) 20 MG capsule Take 20 mg by mouth daily.      . simvastatin (ZOCOR) 40 MG tablet Take 40 mg by mouth every evening.       No current facility-administered medications for this visit.    Allergies Sulfa antibiotics  Family History: Family History  Problem Relation Age of Onset  . CAD Father   . Hypertension Father   . Cancer    . Diabetes Brother     Social History: History   Social History  . Marital Status: Single    Spouse Name: N/A    Number of Children: N/A  . Years of Education: N/A   Occupational History  . Not on file.   Social History Main Topics  . Smoking status: Not on file  . Smokeless tobacco: Not on file  . Alcohol Use: Not on file  . Drug Use: Not on file  . Sexual Activity: Not on file   Other Topics Concern  . Not  on file   Social History Narrative  . No narrative on file    Electrocardiogram: 9/14 SR nonspecific STT wave changes done in [place]  Assessment and Plan

## 2013-08-31 NOTE — Assessment & Plan Note (Signed)
Cholesterol is at goal.  Continue current dose of statin and diet Rx.  No myalgias or side effects.  F/U  LFT's in 6 months. Lab Results  Component Value Date   LDLCALC  Value: 102        Total Cholesterol/HDL:CHD Risk Coronary Heart Disease Risk Table                     Men   Women  1/2 Average Risk   3.4   3.3  Average Risk       5.0   4.4  2 X Average Risk   9.6   7.1  3 X Average Risk  23.4   11.0        Use the calculated Patient Ratio above and the CHD Risk Table to determine the patient's CHD Risk.        ATP III CLASSIFICATION (LDL):  <100     mg/dL   Optimal  161-096  mg/dL   Near or Above                    Optimal  130-159  mg/dL   Borderline  045-409  mg/dL   High  >811     mg/dL   Very High* 91/47/8295   Labs to be repeated on simvastatin in 3 months has low fat diet recommendations

## 2013-09-08 ENCOUNTER — Encounter (HOSPITAL_COMMUNITY)
Admission: RE | Admit: 2013-09-08 | Discharge: 2013-09-08 | Disposition: A | Discharge status: Home / Self Care | Payer: Federal, State, Local not specified - PPO | Source: Ambulatory Visit | Attending: Cardiovascular Disease | Admitting: Cardiovascular Disease

## 2013-09-08 NOTE — Progress Notes (Signed)
Cardiac Rehab Medication Review by a Pharmacist  Does the patient  feel that his/her medications are working for him/her?  yes  Has the patient been experiencing any side effects to the medications prescribed?  no  Does the patient measure his/her own blood pressure or blood glucose at home?  no   Does the patient have any problems obtaining medications due to transportation or finances?   no  Understanding of regimen: excellent Understanding of indications: excellent Potential of compliance: excellent   Natalie Hensley 09/08/2013 8:08 AM

## 2013-09-12 ENCOUNTER — Encounter (HOSPITAL_COMMUNITY)
Admission: RE | Admit: 2013-09-12 | Discharge: 2013-09-12 | Disposition: A | Discharge status: Home / Self Care | Payer: Federal, State, Local not specified - PPO | Source: Ambulatory Visit | Attending: Cardiovascular Disease | Admitting: Cardiovascular Disease

## 2013-09-12 DIAGNOSIS — Z5189 Encounter for other specified aftercare: Secondary | ICD-10-CM | POA: Insufficient documentation

## 2013-09-12 DIAGNOSIS — K219 Gastro-esophageal reflux disease without esophagitis: Secondary | ICD-10-CM | POA: Insufficient documentation

## 2013-09-12 DIAGNOSIS — I10 Essential (primary) hypertension: Secondary | ICD-10-CM | POA: Insufficient documentation

## 2013-09-12 DIAGNOSIS — C73 Malignant neoplasm of thyroid gland: Secondary | ICD-10-CM | POA: Insufficient documentation

## 2013-09-12 NOTE — Progress Notes (Signed)
Pt in today for first exercise session in Cardiac Rehab Phase II.  Pt tolerated light exercise with some incisional discomfort particular on the nustep.  Pt noted at the completion of exercise her chest felt tender particularly in the area where she is doing wet to dry dressing changes.  Pt advised not to use arms during exercise.  Pt will see her primary MD on Thursday for wound check.  Pt plans to take Tylenol prn prior to exercise on Wednesday.  Monitor showed Sr with rare PAC during exercise.  Pt pre exercise blood glucose reading 105.  Pt given lemonade and peanut butter crackers for stabilization of blood glucose.  Recommended pt eat a protein snack prior to exercise due to eating breakfast early.  Pt verbalized understanding and is in agreement.  PHQ2 score 1.  Pt feels positive about her recovery and participation in cardiac rehab.  Alanson Aly, BSN

## 2013-09-14 ENCOUNTER — Encounter (HOSPITAL_COMMUNITY)
Admission: RE | Admit: 2013-09-14 | Discharge: 2013-09-14 | Disposition: A | Discharge status: Home / Self Care | Payer: Federal, State, Local not specified - PPO | Source: Ambulatory Visit | Attending: Cardiovascular Disease | Admitting: Cardiovascular Disease

## 2013-09-14 LAB — GLUCOSE, CAPILLARY: Glucose-Capillary: 131 mg/dL — ABNORMAL HIGH (ref 70–99)

## 2013-09-16 ENCOUNTER — Encounter (HOSPITAL_COMMUNITY)
Admission: RE | Admit: 2013-09-16 | Discharge: 2013-09-16 | Disposition: A | Discharge status: Home / Self Care | Payer: Federal, State, Local not specified - PPO | Source: Ambulatory Visit | Attending: Cardiovascular Disease | Admitting: Cardiovascular Disease

## 2013-09-16 LAB — GLUCOSE, CAPILLARY: Glucose-Capillary: 104 mg/dL — ABNORMAL HIGH (ref 70–99)

## 2013-09-19 ENCOUNTER — Encounter (HOSPITAL_COMMUNITY)
Admission: RE | Admit: 2013-09-19 | Discharge: 2013-09-19 | Disposition: A | Discharge status: Home / Self Care | Payer: Federal, State, Local not specified - PPO | Source: Ambulatory Visit | Attending: Cardiovascular Disease | Admitting: Cardiovascular Disease

## 2013-09-19 LAB — GLUCOSE, CAPILLARY: Glucose-Capillary: 109 mg/dL — ABNORMAL HIGH (ref 70–99)

## 2013-09-19 NOTE — Progress Notes (Signed)
Reviewed home exercise with pt today.  Pt plans to continue walking at home and using gym in apartment complex for exercise.  Reviewed THR, pulse, RPE, sign and symptoms, and when to call 911 or MD.  Pt voiced understanding.  Fabio Pierce, MA, ACSM RCEP

## 2013-09-21 ENCOUNTER — Encounter (HOSPITAL_COMMUNITY)
Admission: RE | Admit: 2013-09-21 | Discharge: 2013-09-21 | Disposition: A | Discharge status: Home / Self Care | Payer: Federal, State, Local not specified - PPO | Source: Ambulatory Visit | Attending: Cardiovascular Disease | Admitting: Cardiovascular Disease

## 2013-09-21 NOTE — Progress Notes (Signed)
Nutrition Note Spoke with pt. Pt struggling with "food fear." Strategies to manage fear of food discussed. Pt encouraged to track her food intake using a food journal (e.g. [email or web address]) to monitor her intake and allow her to manage her food intake rather than being afraid to eat. Pt expressed understanding of the information provided. Continue client-centered nutrition education by RD as part of interdisciplinary care.  Monitor and evaluate progress toward nutrition goal with team.  Mickle Plumb, M.Ed, RD, LDN, CDE 09/21/2013 2:19 PM

## 2013-09-23 ENCOUNTER — Encounter (HOSPITAL_COMMUNITY)
Admission: RE | Admit: 2013-09-23 | Discharge: 2013-09-23 | Disposition: A | Discharge status: Home / Self Care | Payer: Federal, State, Local not specified - PPO | Source: Ambulatory Visit | Attending: Cardiovascular Disease | Admitting: Cardiovascular Disease

## 2013-09-23 LAB — GLUCOSE, CAPILLARY: Glucose-Capillary: 128 mg/dL — ABNORMAL HIGH (ref 70–99)

## 2013-09-26 ENCOUNTER — Encounter (HOSPITAL_COMMUNITY)
Admission: RE | Admit: 2013-09-26 | Discharge: 2013-09-26 | Disposition: A | Discharge status: Home / Self Care | Payer: Federal, State, Local not specified - PPO | Source: Ambulatory Visit | Attending: Cardiovascular Disease | Admitting: Cardiovascular Disease

## 2013-09-28 ENCOUNTER — Encounter (HOSPITAL_COMMUNITY)
Admission: RE | Admit: 2013-09-28 | Discharge: 2013-09-28 | Disposition: A | Discharge status: Home / Self Care | Payer: Federal, State, Local not specified - PPO | Source: Ambulatory Visit | Attending: Cardiovascular Disease | Admitting: Cardiovascular Disease

## 2013-09-28 NOTE — Progress Notes (Signed)
Natalie Hensley reported that she went to her primary care physician this morning due to some blistering around her sternal inciscion. Upon inspection mid sternal incision appears red with a scabbed area towards the distal end of the incision.  Natalie Hensley said she noticed some blistering around her incision. Natalie Hensley said her family doctor took a culture this morning and notified her surgeon, Dr Iona Coach in [place], [place]. I called DR Cipriano Mile office to check whether it is okay for Natalie Hensley to exercise today. I will hold Natalie Hensley from exercising today until I hear from Dr Cipriano Mile office. Patient stated understanding.

## 2013-09-30 ENCOUNTER — Encounter (HOSPITAL_COMMUNITY)
Admission: RE | Admit: 2013-09-30 | Discharge: 2013-09-30 | Disposition: A | Discharge status: Home / Self Care | Payer: Federal, State, Local not specified - PPO | Source: Ambulatory Visit | Attending: Cardiovascular Disease | Admitting: Cardiovascular Disease

## 2013-09-30 NOTE — Progress Notes (Addendum)
Pt returned to exercise today.  Pt seen in the office at [facility] by Dr. Iona Coach.  Pt reported that Dr. Iona Coach was not concerned about the blisters.  He felt the blisters were a result of the healing process to the incision.  Pt had been packing the incision but at this point it was completely healed and no longer needed any packing.  The blisters were a result of fluid that "had no where to go but to form the blisters.  Pt greatly relieved and assured.  Pt will return to work the week after thanksgiving.  Pt will work part time on Tuesdays and Thursdays to begin with and gradual add more days as tolerated.  Pt tolerated light exercise with no complaints.Alanson Aly, BSN

## 2013-10-03 ENCOUNTER — Encounter (HOSPITAL_COMMUNITY)
Admission: RE | Admit: 2013-10-03 | Discharge: 2013-10-03 | Disposition: A | Discharge status: Home / Self Care | Payer: Federal, State, Local not specified - PPO | Source: Ambulatory Visit | Attending: Cardiovascular Disease | Admitting: Cardiovascular Disease

## 2013-10-05 ENCOUNTER — Encounter (HOSPITAL_COMMUNITY)
Admission: RE | Admit: 2013-10-05 | Discharge: 2013-10-05 | Disposition: A | Discharge status: Home / Self Care | Payer: Federal, State, Local not specified - PPO | Source: Ambulatory Visit | Attending: Cardiovascular Disease | Admitting: Cardiovascular Disease

## 2013-10-10 ENCOUNTER — Encounter (HOSPITAL_COMMUNITY)
Admission: RE | Admit: 2013-10-10 | Discharge: 2013-10-10 | Disposition: A | Discharge status: Home / Self Care | Payer: Federal, State, Local not specified - PPO | Source: Ambulatory Visit | Attending: Cardiovascular Disease | Admitting: Cardiovascular Disease

## 2013-10-10 DIAGNOSIS — Z5189 Encounter for other specified aftercare: Secondary | ICD-10-CM | POA: Insufficient documentation

## 2013-10-10 DIAGNOSIS — K219 Gastro-esophageal reflux disease without esophagitis: Secondary | ICD-10-CM | POA: Insufficient documentation

## 2013-10-10 DIAGNOSIS — C73 Malignant neoplasm of thyroid gland: Secondary | ICD-10-CM | POA: Insufficient documentation

## 2013-10-10 DIAGNOSIS — I10 Essential (primary) hypertension: Secondary | ICD-10-CM | POA: Insufficient documentation

## 2013-10-12 ENCOUNTER — Encounter (HOSPITAL_COMMUNITY)
Admission: RE | Admit: 2013-10-12 | Discharge: 2013-10-12 | Disposition: A | Discharge status: Home / Self Care | Payer: Federal, State, Local not specified - PPO | Source: Ambulatory Visit | Attending: Cardiovascular Disease | Admitting: Cardiovascular Disease

## 2013-10-12 NOTE — Progress Notes (Signed)
Natalie Hensley 57 y.o. female Nutrition Note Spoke with pt.  Nutrition Plan and Nutrition Survey goals reviewed with pt. Pt is following Step 2 of the Therapeutic Lifestyle Changes diet. Pt wants to lose wt. Pt has been trying to lose wt by watching portion sizes. Pt states her wt is now 160 lb, which is down approximately 9 lb since starting Cardiac Rehab. Wt loss tips reviewed.  Pt is diabetic. Last A1c indicates blood glucose well-controlled. Pt controlling blood glucose by counting carbs. Pt consuming "about 30-35 grams of carbs/meal or less." Recommended minimum carbs/day discussed. Pt agreeable to increasing carbs to 45 gm/meal. Pt checks CBG's 1-2 times daily. Per pt, fasting CBG's range from 104-136 mg/dL; most CBG's < 161 mg/dL. This Clinical research associate went over Diabetes Education test results. Pt expressed understanding of the information reviewed. Pt aware of nutrition education classes offered and and has attended several nutrition classes .  Nutrition Diagnosis   Food-and nutrition-related knowledge deficit related to lack of exposure to information as related to diagnosis of: ? CVD ? DM (A1c 6.9)   Overweight related to excessive energy intake as evidenced by a BMI of 26.4  Nutrition RX/ Estimated Daily Nutrition Needs for: wt loss  1200-1700 Kcal, 30-45 gm fat, 8-13 gm sat fat, 1.2-1.7 gm trans-fat, <1500 mg sodium, 150-175 gm CHO   Nutrition Intervention   Pt's individual nutrition plan reviewed with pt.   Benefits of adopting Therapeutic Lifestyle Changes discussed when Medficts reviewed.   Pt to attend the Portion Distortion class   Pt to attend the  ? Nutrition I class                     ? Nutrition II class - met 09/20/13        ? Diabetes Blitz class - met 09/13/13       ? Diabetes Q & A class - met 09/16/13   Pt given handouts for: ? Nutrition I class   Continue client-centered nutrition education by RD, as part of interdisciplinary care. Goal(s)   Pt to identify food quantities  necessary to achieve: ? wt loss to a goal wt of 145-163 lb (66.1-74.3 kg) at graduation from cardiac rehab.  Monitor and Evaluate progress toward nutrition goal with team. Nutrition Risk: Change to Moderate Mickle Plumb, M.Ed, RD, LDN, CDE 10/12/2013 1:43 PM

## 2013-10-14 ENCOUNTER — Encounter (HOSPITAL_COMMUNITY)
Admission: RE | Admit: 2013-10-14 | Discharge: 2013-10-14 | Disposition: A | Discharge status: Home / Self Care | Payer: Federal, State, Local not specified - PPO | Source: Ambulatory Visit | Attending: Cardiovascular Disease | Admitting: Cardiovascular Disease

## 2013-10-17 ENCOUNTER — Encounter (HOSPITAL_COMMUNITY)
Admission: RE | Admit: 2013-10-17 | Discharge: 2013-10-17 | Disposition: A | Discharge status: Home / Self Care | Payer: Federal, State, Local not specified - PPO | Source: Ambulatory Visit | Attending: Cardiovascular Disease | Admitting: Cardiovascular Disease

## 2013-10-19 ENCOUNTER — Encounter (HOSPITAL_COMMUNITY)
Admission: RE | Admit: 2013-10-19 | Discharge: 2013-10-19 | Disposition: A | Discharge status: Home / Self Care | Payer: Federal, State, Local not specified - PPO | Source: Ambulatory Visit | Attending: Cardiovascular Disease | Admitting: Cardiovascular Disease

## 2013-10-21 ENCOUNTER — Encounter (HOSPITAL_COMMUNITY)
Admission: RE | Admit: 2013-10-21 | Discharge: 2013-10-21 | Disposition: A | Discharge status: Home / Self Care | Payer: Federal, State, Local not specified - PPO | Source: Ambulatory Visit | Attending: Cardiovascular Disease | Admitting: Cardiovascular Disease

## 2013-10-24 ENCOUNTER — Encounter (HOSPITAL_COMMUNITY): Payer: Federal, State, Local not specified - PPO

## 2013-10-26 ENCOUNTER — Encounter (HOSPITAL_COMMUNITY): Payer: Federal, State, Local not specified - PPO

## 2013-10-26 ENCOUNTER — Encounter (HOSPITAL_COMMUNITY)
Admission: RE | Admit: 2013-10-26 | Discharge: 2013-10-26 | Disposition: A | Discharge status: Home / Self Care | Payer: Federal, State, Local not specified - PPO | Source: Ambulatory Visit | Attending: Cardiovascular Disease | Admitting: Cardiovascular Disease

## 2013-10-28 ENCOUNTER — Encounter (HOSPITAL_COMMUNITY)
Admission: RE | Admit: 2013-10-28 | Discharge: 2013-10-28 | Disposition: A | Discharge status: Home / Self Care | Payer: Federal, State, Local not specified - PPO | Source: Ambulatory Visit | Attending: Cardiovascular Disease | Admitting: Cardiovascular Disease

## 2013-10-28 ENCOUNTER — Encounter (HOSPITAL_COMMUNITY): Payer: Federal, State, Local not specified - PPO

## 2013-10-31 ENCOUNTER — Encounter (HOSPITAL_COMMUNITY)
Admission: RE | Admit: 2013-10-31 | Discharge: 2013-10-31 | Disposition: A | Discharge status: Home / Self Care | Payer: Federal, State, Local not specified - PPO | Source: Ambulatory Visit | Attending: Cardiovascular Disease | Admitting: Cardiovascular Disease

## 2013-10-31 ENCOUNTER — Encounter (HOSPITAL_COMMUNITY): Payer: Federal, State, Local not specified - PPO

## 2013-11-02 ENCOUNTER — Encounter (HOSPITAL_COMMUNITY): Payer: Federal, State, Local not specified - PPO

## 2013-11-02 ENCOUNTER — Encounter (HOSPITAL_COMMUNITY)
Admission: RE | Admit: 2013-11-02 | Discharge: 2013-11-02 | Disposition: A | Discharge status: Home / Self Care | Payer: Federal, State, Local not specified - PPO | Source: Ambulatory Visit | Attending: Cardiovascular Disease | Admitting: Cardiovascular Disease

## 2013-11-07 ENCOUNTER — Encounter (HOSPITAL_COMMUNITY)
Admission: RE | Admit: 2013-11-07 | Discharge: 2013-11-07 | Disposition: A | Discharge status: Home / Self Care | Payer: Federal, State, Local not specified - PPO | Source: Ambulatory Visit | Attending: Cardiovascular Disease | Admitting: Cardiovascular Disease

## 2013-11-07 ENCOUNTER — Encounter (HOSPITAL_COMMUNITY): Payer: Federal, State, Local not specified - PPO

## 2013-11-09 ENCOUNTER — Encounter (HOSPITAL_COMMUNITY)
Admission: RE | Admit: 2013-11-09 | Discharge: 2013-11-09 | Disposition: A | Discharge status: Home / Self Care | Payer: Federal, State, Local not specified - PPO | Source: Ambulatory Visit | Attending: Cardiovascular Disease | Admitting: Cardiovascular Disease

## 2013-11-09 ENCOUNTER — Encounter (HOSPITAL_COMMUNITY): Payer: Self-pay

## 2013-11-09 ENCOUNTER — Encounter (HOSPITAL_COMMUNITY): Payer: Federal, State, Local not specified - PPO

## 2013-11-09 NOTE — Progress Notes (Signed)
Pt graduated from cardiac rehab program today.  Medication list reconciled.  PHQ9 score- 0 .  Pt has made significant lifestyle changes and should be commended for her success. Pt plans to continue exercising on her own walking and using fitness center in her apartment complex.

## 2013-11-11 ENCOUNTER — Encounter (HOSPITAL_COMMUNITY): Payer: Federal, State, Local not specified - PPO

## 2013-11-14 ENCOUNTER — Encounter (HOSPITAL_COMMUNITY): Payer: Federal, State, Local not specified - PPO

## 2013-11-16 ENCOUNTER — Encounter (HOSPITAL_COMMUNITY): Payer: Federal, State, Local not specified - PPO

## 2013-11-18 ENCOUNTER — Encounter (HOSPITAL_COMMUNITY): Payer: Federal, State, Local not specified - PPO

## 2013-11-21 ENCOUNTER — Encounter (HOSPITAL_COMMUNITY): Payer: Federal, State, Local not specified - PPO

## 2013-11-23 ENCOUNTER — Encounter (HOSPITAL_COMMUNITY): Payer: Federal, State, Local not specified - PPO

## 2013-11-25 ENCOUNTER — Encounter (HOSPITAL_COMMUNITY): Payer: Federal, State, Local not specified - PPO

## 2013-11-28 ENCOUNTER — Encounter (HOSPITAL_COMMUNITY): Payer: Federal, State, Local not specified - PPO

## 2013-11-30 ENCOUNTER — Encounter (HOSPITAL_COMMUNITY): Payer: Federal, State, Local not specified - PPO

## 2013-12-02 ENCOUNTER — Encounter (HOSPITAL_COMMUNITY): Payer: Federal, State, Local not specified - PPO

## 2013-12-05 ENCOUNTER — Encounter (HOSPITAL_COMMUNITY): Payer: Federal, State, Local not specified - PPO

## 2013-12-07 ENCOUNTER — Encounter (HOSPITAL_COMMUNITY): Payer: Federal, State, Local not specified - PPO

## 2013-12-09 ENCOUNTER — Encounter (HOSPITAL_COMMUNITY): Payer: Federal, State, Local not specified - PPO

## 2013-12-12 ENCOUNTER — Encounter (HOSPITAL_COMMUNITY): Payer: Federal, State, Local not specified - PPO

## 2013-12-14 ENCOUNTER — Encounter (HOSPITAL_COMMUNITY): Payer: Federal, State, Local not specified - PPO

## 2013-12-16 ENCOUNTER — Encounter (HOSPITAL_COMMUNITY): Payer: Federal, State, Local not specified - PPO

## 2014-01-03 ENCOUNTER — Ambulatory Visit (INDEPENDENT_AMBULATORY_CARE_PROVIDER_SITE_OTHER): Payer: Federal, State, Local not specified - PPO | Admitting: Cardiovascular Disease

## 2014-01-03 ENCOUNTER — Encounter (INDEPENDENT_AMBULATORY_CARE_PROVIDER_SITE_OTHER): Payer: Self-pay

## 2014-01-03 ENCOUNTER — Encounter: Payer: Self-pay | Admitting: Cardiovascular Disease

## 2014-01-03 VITALS — BP 118/78 | HR 73 | Ht 68.0 in | Wt 162.0 lb

## 2014-01-03 DIAGNOSIS — E039 Hypothyroidism, unspecified: Secondary | ICD-10-CM

## 2014-01-03 DIAGNOSIS — Z79899 Other long term (current) drug therapy: Secondary | ICD-10-CM

## 2014-01-03 DIAGNOSIS — E119 Type 2 diabetes mellitus without complications: Secondary | ICD-10-CM

## 2014-01-03 DIAGNOSIS — I219 Acute myocardial infarction, unspecified: Secondary | ICD-10-CM

## 2014-01-03 DIAGNOSIS — I25810 Atherosclerosis of coronary artery bypass graft(s) without angina pectoris: Secondary | ICD-10-CM

## 2014-01-03 DIAGNOSIS — E785 Hyperlipidemia, unspecified: Secondary | ICD-10-CM

## 2014-01-03 DIAGNOSIS — I10 Essential (primary) hypertension: Secondary | ICD-10-CM

## 2014-01-03 LAB — HEPATIC FUNCTION PANEL
ALBUMIN: 4.2 g/dL (ref 3.5–5.2)
ALT: 25 U/L (ref 0–35)
AST: 22 U/L (ref 0–37)
Alkaline Phosphatase: 78 U/L (ref 39–117)
Bilirubin, Direct: 0 mg/dL (ref 0.0–0.3)
TOTAL PROTEIN: 7.1 g/dL (ref 6.0–8.3)
Total Bilirubin: 0.8 mg/dL (ref 0.3–1.2)

## 2014-01-03 LAB — LIPID PANEL
CHOLESTEROL: 203 mg/dL — AB (ref 0–200)
HDL: 58.4 mg/dL (ref >39.00)
TRIGLYCERIDES: 245 mg/dL — AB (ref 0.0–149.0)
Total CHOL/HDL Ratio: 3
VLDL: 49 mg/dL — ABNORMAL HIGH (ref 0.0–40.0)

## 2014-01-03 LAB — LDL CHOLESTEROL, DIRECT: Direct LDL: 86 mg/dL

## 2014-01-03 NOTE — Assessment & Plan Note (Signed)
Discussed low carb diet.  Target hemoglobin A1c is 6.5 or less.  Continue current medications.  

## 2014-01-03 NOTE — Patient Instructions (Signed)
Your physician wants you to follow-up in:   6 MONTHS WITH  DR NISHAN  You will receive a reminder letter in the mail two months in advance. If you don't receive a letter, please call our office to schedule the follow-up appointment. Your physician recommends that you continue on your current medications as directed. Please refer to the Current Medication list given to you today.   Your physician recommends that you return for lab work in:   TODAY LIPID LIVER 

## 2014-01-03 NOTE — Assessment & Plan Note (Signed)
Continue replacement TSH followed by [facility]  Important in regard to cholesterol that TSH be normal

## 2014-01-03 NOTE — Addendum Note (Signed)
Addended by: Eulis Foster on: 01/03/2014 09:09 AM   Modules accepted: Orders

## 2014-01-03 NOTE — Assessment & Plan Note (Signed)
Check cholesterol today and adjust meds as needed for Trglycerides under 150 and LDL under 100

## 2014-01-03 NOTE — Assessment & Plan Note (Signed)
CABG 9/14 in [place].  DAT till 9/15 with MI prior to  Sternum still sore at times.  Consider CT if persistant in 6 months given infection PRN NSAI and tylenol  Ok to go camping

## 2014-01-03 NOTE — Progress Notes (Signed)
Patient ID: Natalie Hensley, female   DOB: 1956-09-14, 58 y.o.   MRN: [id number] 58 yo previously seen by [facility]. Last seen 2 years ago. No documented CAD. Family history CAD and elevated lipids 5 weeks ago had SEMI while eating dinner at [facility]. Seen by [facility] CABG 07/27/13 by Dr Noralee Stain. [place] to LAD SVG to RCA and SVG to D1. 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 Post op course complicated by sternal wound infection. Had 5 days of antibiotics with wound vac. Also 10 day course of oral Levaquin  No fevers or recurrent drainiage. Musculoskeletal pain relieved with tylenol in chest. Compliant with meds.   Needs lipids today A1c 6.5 checked by [facility] and thyroid checked by [facility]   ROS: Denies fever, malais, weight loss, blurry vision, decreased visual acuity, cough, sputum, SOB, hemoptysis, pleuritic pain, palpitaitons, heartburn, abdominal pain, melena, lower extremity edema, claudication, or rash.  All other systems reviewed and negative  General: Affect appropriate Healthy:  appears stated age 58: normal Neck supple with no adenopathy JVP normal no bruits no thyromegaly Lungs clear with no wheezing and good diaphragmatic motion Heart:  S1/S2 no murmur, no rub, gallop or click Sternum now well healed  PMI normal Abdomen: benighn, BS positve, no tenderness, no AAA no bruit.  No HSM or HJR Distal pulses intact with no bruits No edema Neuro non-focal Skin warm and dry No muscular weakness   Current Outpatient Prescriptions  Medication Sig Dispense Refill  . acetaminophen (TYLENOL) 500 MG tablet Take 1,000 mg by mouth every 8 (eight) hours as needed for pain.       Marland Kitchen ALPRAZolam (XANAX) 0.25 MG tablet Take 0.25 mg by mouth 3 (three) times daily as needed for sleep.      Marland Kitchen aspirin 81 MG tablet Take 81 mg by mouth daily.      . clopidogrel (PLAVIX) 75 MG tablet Take 75 mg by mouth daily.      Marland Kitchen levothyroxine (SYNTHROID, LEVOTHROID) 150 MCG tablet Take  137 mcg by mouth daily before breakfast. Decreased to 137 mcg daily 11/3      . metFORMIN (GLUCOPHAGE) 500 MG tablet Take 500 mg by mouth daily with supper.       . metoprolol (LOPRESSOR) 25 MG tablet 1/2 tab  Bid      . omeprazole (PRILOSEC) 20 MG capsule Take 20 mg by mouth daily.      . simvastatin (ZOCOR) 40 MG tablet Take 40 mg by mouth every evening.       No current facility-administered medications for this visit.    Allergies  Sulfa antibiotics  Electrocardiogram:  SR rate 73  LAE possible old IMI poor R wave progression   Assessment and Plan

## 2014-01-03 NOTE — Assessment & Plan Note (Signed)
Well controlled.  Continue current medications and low sodium Dash type diet.    

## 2014-01-06 ENCOUNTER — Telehealth: Payer: Self-pay | Admitting: Cardiovascular Disease

## 2014-01-06 NOTE — Telephone Encounter (Signed)
Left detailed message for pt on personal voice mail of lab results

## 2014-01-06 NOTE — Telephone Encounter (Signed)
New message     Pt want her lab results---pt need to refill her simvastatin however, she want her results first--in case the doctor changes her medication dosage.

## 2014-02-21 ENCOUNTER — Telehealth: Payer: Self-pay | Admitting: Cardiovascular Disease

## 2014-02-21 NOTE — Telephone Encounter (Signed)
New message     Last thurs patient had chest pain,nausea and lightheadedness.  Took a nitro on thurs.  Want nurse to know and advise

## 2014-02-21 NOTE — Telephone Encounter (Signed)
I spoke with the pt and she called the office to make [name] aware of symptoms she has been experiencing. The pt was camping on Thursday and she and another person lifted a kayak off a trailer and then they carried it down a hill.  The pt developed chest pain while going down the hill. She sat down and became lightheaded and nauseous.  The pt did have to rest when going back up the hill and then she took a NTG which helped her symptoms.  Since this episode the pt denies any further chest pain but she has noticed fatigue and lightheadedness.  The pt walked 15 minutes across campus today at the [facility] and she felt fatigued and lightheaded.  I attempted to bring the pt into the office today but she is at work in [place] and cannot make an appointment.   I have scheduled the pt to see Ermalinda Barrios on 02/22/14. I advised the pt to proceed to the ER if she has any further episodes of CP. Pt agreed with plan.

## 2014-02-22 ENCOUNTER — Ambulatory Visit (INDEPENDENT_AMBULATORY_CARE_PROVIDER_SITE_OTHER): Payer: Federal, State, Local not specified - PPO | Admitting: Physician Assistant

## 2014-02-22 ENCOUNTER — Encounter: Payer: Self-pay | Admitting: Physician Assistant

## 2014-02-22 VITALS — BP 128/74 | HR 66 | Ht 68.0 in | Wt 159.0 lb

## 2014-02-22 DIAGNOSIS — R079 Chest pain, unspecified: Secondary | ICD-10-CM

## 2014-02-22 DIAGNOSIS — I10 Essential (primary) hypertension: Secondary | ICD-10-CM

## 2014-02-22 NOTE — Assessment & Plan Note (Signed)
Blood pressure stable ? ?

## 2014-02-22 NOTE — Assessment & Plan Note (Signed)
Patient complains of chest pain while carrying a kayak last week. Since then she complains of shortness of breath, dizziness, and nausea with little exertion. She says this was similar to her symptoms prior to her CABG. Her EKG is unchanged. Recommend patient have a stress Myoview to rule out ischemia. Followup with Dr.Nishan.

## 2014-02-22 NOTE — Patient Instructions (Signed)
Your physician has requested that you have en exercise stress myoview.  Please follow instruction sheet, as given.  Your physician recommends that you schedule a follow-up appointment in: [place]  Your physician recommends that you continue on your current medications as directed. Please refer to the Current Medication list given to you today.

## 2014-02-22 NOTE — Progress Notes (Signed)
HPI:  This is a 58 year old patient Dr. Johnsie Cancel who had an MI in [place] followed by a CABG on 07/27/13 with a [place] to the LAD, SVG to the RCA, and SVG to the diagonal 1. EF was normal. Postop course was complicated by sternal wound infection treated with antibiotics and wound VAC.  The patient was caring a kayak with her friend last Thursday and developed a sharp pain in her chest.  She tried to walk back up the hill and took a nitroglycerin which eased the pain. She says this was just like when she had her MI pain. She denied any tightness or heaviness but she said it was the exact same pain as her MI. She felt a little better and then tried to go out in the lake and kayak but became nauseous, dizzy and sweaty. She stopped paddling and felt better. Yesterday while she was walking 15 minutes across campus at the [facility] carrying a backpack she became fatigued, short of breath, dizzy and nauseous. She says she's been walking every day since her bypass and has never felt this way.   Allergies-- Sulfa Antibiotics -- Rash  Current Outpatient Prescriptions on File Prior to Visit: acetaminophen (TYLENOL) 500 MG tablet, Take 1,000 mg by mouth every 8 (eight) hours as needed for pain. , Disp: , Rfl:  ALPRAZolam (XANAX) 0.25 MG tablet, Take 0.25 mg by mouth 3 (three) times daily as needed for sleep., Disp: , Rfl:  aspirin 81 MG tablet, Take 81 mg by mouth daily., Disp: , Rfl:  clopidogrel (PLAVIX) 75 MG tablet, Take 75 mg by mouth daily., Disp: , Rfl:   metFORMIN (GLUCOPHAGE) 500 MG tablet, 500 mg. Take 1 tablet 4 times a week, Disp: , Rfl:  metoprolol (LOPRESSOR) 25 MG tablet, 1/2 tab  Bid, Disp: , Rfl:  omeprazole (PRILOSEC) 20 MG capsule, Take 20 mg by mouth daily., Disp: , Rfl:  simvastatin (ZOCOR) 40 MG tablet, Take 40 mg by mouth every evening., Disp: , Rfl:   No current facility-administered medications on file prior to visit.   Past Medical History:   Myocardial infarction                                         HTN (hypertension)                                           Hyperlipidemia                                               Urinary frequency                                            Depression                                                   Thyroid cancer  Anxiety                                                      Diabetes                                                     Goiter                                                       Hypothyroidism                                               Carpal tunnel syndrome                                      Past Surgical History:   CORONARY ARTERY BYPASS GRAFT                     07/27/2013    TOTAL ABDOMINAL HYSTERECTOMY                                  LAPAROSCOPIC APPENDECTOMY                                    Review of patient's family history indicates:   CAD                            Father                   Hypertension                   Father                   Cancer                                                  Diabetes                       Brother                  Social History   Marital Status: Single              Spouse Name:                      Years of Education:  Number of children:             Occupational History   None on file  Social History Main Topics   Smoking Status: Never Smoker                     Smokeless Status: Not on file                      Alcohol Use: Not on file    Drug Use: Not on file    Sexual Activity: Not on file        Other Topics            Concern   None on file  Social History Narrative   None on file    ROS: See history of present illness otherwise negative   PHYSICAL EXAM: Well-nournished, in no acute distress. Neck: No JVD, HJR, Bruit, or thyroid enlargement  Lungs: No tachypnea, clear without wheezing, rales, or rhonchi  Cardiovascular: RRR, PMI not displaced, heart sounds  normal, no murmurs, gallops, bruit, thrill, or heave.  Abdomen: BS normal. Soft without organomegaly, masses, lesions or tenderness.  Extremities: without cyanosis, clubbing or edema. Good distal pulses bilateral  SKin: Warm, no lesions or rashes   Musculoskeletal: No deformities  Neuro: no focal signs  BP 128/74  Pulse 66  Ht 5\' 8"  (1.727 m)  Wt 159 lb (72.122 kg)  BMI 24.18 kg/m2   EKG: Normal sinus rhythm with poor R wave progression nonspecific ST-T wave changes, no acute change from prior tracing

## 2014-02-23 ENCOUNTER — Ambulatory Visit (HOSPITAL_COMMUNITY): Payer: Federal, State, Local not specified - PPO | Attending: Cardiovascular Disease | Admitting: Radiology

## 2014-02-23 VITALS — BP 139/86 | HR 67 | Ht 68.0 in | Wt 160.0 lb

## 2014-02-23 DIAGNOSIS — R42 Dizziness and giddiness: Secondary | ICD-10-CM | POA: Insufficient documentation

## 2014-02-23 DIAGNOSIS — R0989 Other specified symptoms and signs involving the circulatory and respiratory systems: Secondary | ICD-10-CM | POA: Insufficient documentation

## 2014-02-23 DIAGNOSIS — R079 Chest pain, unspecified: Secondary | ICD-10-CM | POA: Insufficient documentation

## 2014-02-23 DIAGNOSIS — I2510 Atherosclerotic heart disease of native coronary artery without angina pectoris: Secondary | ICD-10-CM

## 2014-02-23 DIAGNOSIS — R0609 Other forms of dyspnea: Secondary | ICD-10-CM | POA: Insufficient documentation

## 2014-02-23 MED ORDER — REGADENOSON 0.4 MG/5ML IV SOLN
0.4000 mg | Freq: Once | INTRAVENOUS | Status: AC
  Administered 2014-02-23: 0.4 mg via INTRAVENOUS

## 2014-02-23 MED ORDER — TECHNETIUM TC 99M SESTAMIBI GENERIC - CARDIOLITE
11.0000 | Freq: Once | INTRAVENOUS | Status: AC | PRN
Start: 2014-02-23 — End: 2014-02-23
  Administered 2014-02-23: 11 via INTRAVENOUS

## 2014-02-23 MED ORDER — TECHNETIUM TC 99M SESTAMIBI GENERIC - CARDIOLITE
33.0000 | Freq: Once | INTRAVENOUS | Status: AC | PRN
  Administered 2014-02-23: 33 via INTRAVENOUS

## 2014-02-23 NOTE — Progress Notes (Signed)
[facility] [address] [place], [place] [address] [phone]    Cardiology Nuclear Med Study  Natalie Hensley is a 58 y.o. female     MRN : [id number]     DOB: [birth year]  Procedure Date: 02/23/2014  Nuclear Med Background Indication for Stress Test:  Evaluation for Ischemia and Graft Patency History:  CAD, CABG 2014, MI 2014, Echo 2010, MPI ~10-15 yrs ago (ok per pt) Cardiac Risk Factors: Family History - CAD, Hypertension, Lipids and NIDDM  Symptoms:  Chest Pain, Dizziness and DOE   Nuclear Pre-Procedure Caffeine/Decaff Intake:  None > 12 hrs NPO After: 8:00pm   Lungs:  clear O2 Sat: 98% on room air. IV 0.9% NS with Angio Cath:  22g  IV Site: R Antecubital x 1, tolerated well IV Started by:  Irven Baltimore, RN  Chest Size (in):  38 Cup Size: D  Height: 5\' 8"  (1.727 m)  Weight:  160 lb (72.576 kg)  BMI:  Body mass index is 24.33 kg/(m^2). Tech Comments:  Took Lopressor at 0600 today. Held metformin last night.    Nuclear Med Study 1 or 2 day study: 1 day  Stress Test Type:  Carlton Adam  Reading MD: N/A  Order Authorizing Provider:  Jenkins Rouge, MD, Ermalinda Barrios, [facility]  Resting Radionuclide: Technetium 3m Sestamibi  Resting Radionuclide Dose: 11.0 mCi   Stress Radionuclide:  Technetium 48m Sestamibi  Stress Radionuclide Dose: 33.0 mCi           Stress Protocol Rest HR: 67 Stress HR: 107  Rest BP: 139/86 Stress BP: 154/89  Exercise Time (min): n/a METS: n/a           Dose of Adenosine (mg):  n/a Dose of Lexiscan: 0.4 mg  Dose of Atropine (mg): n/a Dose of Dobutamine: n/a mcg/kg/min (at max HR)  Stress Test Technologist: Glade Lloyd, BS-ES  Nuclear Technologist:  Charlton Amor, CNMT     Rest Procedure:  Myocardial perfusion imaging was performed at rest 45 minutes following the intravenous administration of Technetium 89m Sestamibi. Rest ECG: NSR - Normal EKG  Stress Procedure:  The patient received IV Lexiscan 0.4 mg over  15-seconds.  Technetium 93m Sestamibi injected at 30-seconds.  Quantitative spect images were obtained after a 45 minute delay.  Patient was initially schedule for an exercise nuclear.  Attempted to walk patient but she became very fatigued and lightheaded. Complained of a 2-3/10 chest discomfort.  Had to switch to a Lexican test.  During the infusion, the patient complained of continued lightheadedness that resolved in recovery.  Her chest discomfort also resolved.   Stress ECG: No significant change from baseline ECG  QPS Raw Data Images:  Normal; no motion artifact; normal heart/lung ratio. Stress Images:  There is decreased uptake in the apex. Rest Images:  There is decreased uptake in the apex. Subtraction (SDS):  There is a fixed defect that is most consistent with a previous infarction. Transient Ischemic Dilatation (Normal <1.22):  0.78 Lung/Heart Ratio (Normal <0.45):  0.49  Quantitative Gated Spect Images QGS EDV:  75 ml QGS ESV:  19 ml  Impression Exercise Capacity:  Lexiscan with no exercise. BP Response:  Normal blood pressure response. Clinical Symptoms:   Developed lightheadedness, fatigue and chest discomfort on treadmill. Switched to [employer] ECG Impression:  No significant ST segment change suggestive of ischemia. Comparison with Prior Nuclear Study: No images to compare  Overall Impression:  Low risk stress nuclear study.  There is a  small fixed apical defect consistent with old apical scar.  No ischemia.  LV Ejection Fraction: 74%.  LV Wall Motion:  NL LV Function; NL Wall Motion  Darlin Coco MD

## 2014-02-27 ENCOUNTER — Telehealth: Payer: Self-pay | Admitting: *Deleted

## 2014-02-27 ENCOUNTER — Telehealth: Payer: Self-pay | Admitting: Cardiovascular Disease

## 2014-02-27 NOTE — Telephone Encounter (Signed)
New message ° ° ° ° °Want stress test results °

## 2014-02-27 NOTE — Telephone Encounter (Signed)
lmtcb for stress test results. Number and office hours provided

## 2014-02-27 NOTE — Telephone Encounter (Signed)
Notified pt that Dr. Johnsie Cancel reviewed stress test result and fatigue and nausea not related to heart.

## 2014-02-27 NOTE — Telephone Encounter (Signed)
He nausea and fatigue are not from her heart stress test looked fine and EF normal

## 2014-02-27 NOTE — Telephone Encounter (Signed)
Called wanting to know her stress test results.  According to Natalie Hensley was low risk study but advised that Dr. Johnsie Cancel had not commented on results.  She states she is still very fatigued and nauseated.  States she was walking 30-40 min 5 x a week but is unable to do that now.  She wants to know what could be causing these symptoms.  Not scheduled to see Dr. Johnsie Cancel until 5/11.  Advised would forward message to him and his nurse Everett Graff for evaluation

## 2014-03-20 ENCOUNTER — Ambulatory Visit: Payer: Federal, State, Local not specified - PPO | Admitting: Cardiovascular Disease

## 2014-04-24 ENCOUNTER — Telehealth: Payer: Self-pay | Admitting: Cardiovascular Disease

## 2014-04-24 NOTE — Telephone Encounter (Signed)
New message     Pt had cabg last year----she is reacting to heat a lot different,.  Could it be from the surgery?  Pt is almost due to have her metoprolol refilled--it was originally presc by the doctors in [place]---can Dr Johnsie Cancel refill it for her when time?

## 2014-04-24 NOTE — Telephone Encounter (Signed)
Pt states outside about 1PM yesterday she felt lightheaded and fatigued. She denies chest pain or other cardiac symptoms. She went home, drank some water and began to feel better. I advised pt to stay well hydrated during the hot/humid weather and avoid the hottest times of the day.

## 2014-07-13 ENCOUNTER — Ambulatory Visit (INDEPENDENT_AMBULATORY_CARE_PROVIDER_SITE_OTHER): Payer: Federal, State, Local not specified - PPO | Admitting: Cardiovascular Disease

## 2014-07-13 ENCOUNTER — Encounter: Payer: Self-pay | Admitting: Cardiovascular Disease

## 2014-07-13 VITALS — BP 122/74 | HR 67 | Ht 68.0 in | Wt 154.0 lb

## 2014-07-13 DIAGNOSIS — R079 Chest pain, unspecified: Secondary | ICD-10-CM

## 2014-07-13 DIAGNOSIS — I10 Essential (primary) hypertension: Secondary | ICD-10-CM

## 2014-07-13 DIAGNOSIS — E119 Type 2 diabetes mellitus without complications: Secondary | ICD-10-CM

## 2014-07-13 DIAGNOSIS — E785 Hyperlipidemia, unspecified: Secondary | ICD-10-CM

## 2014-07-13 MED ORDER — SIMVASTATIN 40 MG PO TABS: 40.0000 mg | ORAL_TABLET | Freq: Every evening | ORAL | Status: DC

## 2014-07-13 MED ORDER — NITROGLYCERIN 0.4 MG SL SUBL: 0.4000 mg | SUBLINGUAL_TABLET | SUBLINGUAL | Status: DC | PRN

## 2014-07-13 NOTE — Assessment & Plan Note (Signed)
S/P CABG  myovue non ischemic pain has not returned  Nitro called in continue beta blocker and ASA  D/C Plavix

## 2014-07-13 NOTE — Assessment & Plan Note (Signed)
Cholesterol is at goal.  Continue current dose of statin and diet Rx.  No myalgias or side effects.  F/U  LFT's in 6 months. Lab Results  Component Value Date   LDLCALC  Value: 102        Total Cholesterol/HDL:CHD Risk Coronary Heart Disease Risk Table                     Men   Women  1/2 Average Risk   3.4   3.3  Average Risk       5.0   4.4  2 X Average Risk   9.6   7.1  3 X Average Risk  23.4   11.0        Use the calculated Patient Ratio above and the CHD Risk Table to determine the patient's CHD Risk.        ATP III CLASSIFICATION (LDL):  <100     mg/dL   Optimal  100-129  mg/dL   Near or Above                    Optimal  130-159  mg/dL   Borderline  160-189  mg/dL   High  >190     mg/dL   Very High* 09/30/2009

## 2014-07-13 NOTE — Assessment & Plan Note (Signed)
Well controlled.  Continue current medications and low sodium Dash type diet.    

## 2014-07-13 NOTE — Progress Notes (Signed)
Patient ID: Natalie Hensley, female   DOB: 11-May-1956, 58 y.o.   MRN: [id number] This is a 58 year old patient last seen by PA in Natalie H[place]  Had an MI in [place] followed by a CABG on 07/27/13 with a LIMA to the LAD, SVG to the RCA, and SVG to the diagonal 1. EF was normal. Postop course was complicated by sternal wound infection treated with antibiotics and wound VAC.  The patient was caring a kayak with her friend Natalie Hensley and developed a sharp pain in her chest. She tried to walk back up the hill and took a nitroglycerin which eased the pain. She says this was just like when she had her MI pain. PA ordered f/u myovue which was nonischemic  02/24/14  Small apical scar no ischemia EF 76%   No further pain.  Needs new nitro Should come off plavix.  Due to have lipid and liver in October with Dr Larose Hires     ROS: Denies fever, malais, weight loss, blurry vision, decreased visual acuity, cough, sputum, SOB, hemoptysis, pleuritic pain, palpitaitons, heartburn, abdominal pain, melena, lower extremity edema, claudication, or rash.  All other systems reviewed and negative  General: Affect appropriate Healthy:  appears stated age 33: normal Neck supple with no adenopathy JVP normal no bruits no thyromegaly Lungs clear with no wheezing and good diaphragmatic motion Heart:  S1/S2 no murmur, no rub, gallop or click PMI normal Abdomen: benighn, BS positve, no tenderness, no AAA no bruit.  No HSM or HJR Distal pulses intact with no bruits No edema Neuro non-focal Skin warm and dry No muscular weakness   Current Outpatient Prescriptions  Medication Sig Dispense Refill  . acetaminophen (TYLENOL) 500 MG tablet Take 1,000 mg by mouth every 8 (eight) hours as needed for pain.       Marland Kitchen ALPRAZolam (XANAX) 0.25 MG tablet Take 0.25 mg by mouth 3 (three) times daily as needed for sleep.      Marland Kitchen aspirin 81 MG tablet Take 81 mg by mouth daily.      . clopidogrel (PLAVIX) 75 MG tablet Take 75 mg by mouth daily.       Marland Kitchen levothyroxine (SYNTHROID, LEVOTHROID) 137 MCG tablet Take 137 mcg by mouth daily before breakfast.      . metFORMIN (GLUCOPHAGE) 500 MG tablet 500 mg. Take 1 tablet 4 times a week      . metoprolol (LOPRESSOR) 25 MG tablet 1/2 tab  Bid      . omeprazole (PRILOSEC) 20 MG capsule Take 20 mg by mouth daily.      . simvastatin (ZOCOR) 40 MG tablet Take 40 mg by mouth every evening.       No current facility-administered medications for this visit.    Allergies  Sulfa antibiotics  Electrocardiogram:  SR rate 96  Poor R wave progression cannot r/o anterior MI  Assessment and Plan

## 2014-07-13 NOTE — Patient Instructions (Signed)
Your physician wants you to follow-up in: YEAR WITH DR NISHAN  You will receive a reminder letter in the mail two months in advance. If you don't receive a letter, please call our office to schedule the follow-up appointment.  Your physician recommends that you continue on your current medications as directed. Please refer to the Current Medication list given to you today. 

## 2014-07-13 NOTE — Assessment & Plan Note (Signed)
Discussed low carb diet.  Target hemoglobin A1c is 6.5 or less.  Continue current medications. Last A1c in low six range

## 2014-08-21 ENCOUNTER — Encounter: Payer: Self-pay | Admitting: Cardiovascular Disease

## 2014-08-25 ENCOUNTER — Telehealth: Payer: Self-pay | Admitting: Cardiovascular Disease

## 2014-08-25 ENCOUNTER — Telehealth: Payer: Self-pay | Admitting: *Deleted

## 2014-08-25 DIAGNOSIS — Z79899 Other long term (current) drug therapy: Secondary | ICD-10-CM

## 2014-08-25 DIAGNOSIS — E785 Hyperlipidemia, unspecified: Secondary | ICD-10-CM

## 2014-08-25 MED ORDER — EZETIMIBE 10 MG PO TABS: 10.0000 mg | ORAL_TABLET | Freq: Every day | ORAL | Status: DC

## 2014-08-25 NOTE — Telephone Encounter (Signed)
SEE LAB RESULTS ./CY 

## 2014-08-25 NOTE — Telephone Encounter (Signed)
PT  AWARE OF LAB RESULTS,.CY

## 2014-08-25 NOTE — Telephone Encounter (Signed)
Follow up     Returning Christine's call.  Best times to call is now until 1pm, between 2-2:30 or after 3:30 today.

## 2014-08-25 NOTE — Telephone Encounter (Signed)
New message      Did we get a copy of her bld wk from her PCP---Dr Maurice Small?  Pt is concerned that her cholesterol level had increased.  Please call

## 2014-11-24 ENCOUNTER — Other Ambulatory Visit (HOSPITAL_COMMUNITY): Payer: Self-pay | Admitting: Family Medicine

## 2014-11-24 DIAGNOSIS — Z1231 Encounter for screening mammogram for malignant neoplasm of breast: Secondary | ICD-10-CM

## 2014-11-27 ENCOUNTER — Ambulatory Visit (HOSPITAL_COMMUNITY): Payer: Federal, State, Local not specified - PPO

## 2014-11-27 ENCOUNTER — Other Ambulatory Visit (INDEPENDENT_AMBULATORY_CARE_PROVIDER_SITE_OTHER): Payer: Federal, State, Local not specified - PPO | Admitting: *Deleted

## 2014-11-27 DIAGNOSIS — E785 Hyperlipidemia, unspecified: Secondary | ICD-10-CM

## 2014-11-27 DIAGNOSIS — Z79899 Other long term (current) drug therapy: Secondary | ICD-10-CM

## 2014-11-27 LAB — LIPID PANEL
Cholesterol: 164 mg/dL (ref 0–200)
HDL: 64.6 mg/dL (ref >39.00)
LDL CALC: 69 mg/dL (ref 0–99)
NonHDL: 99.4
Total CHOL/HDL Ratio: 3
Triglycerides: 154 mg/dL — ABNORMAL HIGH (ref 0.0–149.0)
VLDL: 30.8 mg/dL (ref 0.0–40.0)

## 2014-11-27 LAB — HEPATIC FUNCTION PANEL
ALT: 30 U/L (ref 0–35)
AST: 25 U/L (ref 0–37)
Albumin: 4.3 g/dL (ref 3.5–5.2)
Alkaline Phosphatase: 74 U/L (ref 39–117)
Bilirubin, Direct: 0.2 mg/dL (ref 0.0–0.3)
TOTAL PROTEIN: 6.9 g/dL (ref 6.0–8.3)
Total Bilirubin: 0.8 mg/dL (ref 0.2–1.2)

## 2014-11-27 NOTE — Addendum Note (Signed)
Addended by: Eulis Foster on: 11/27/2014 11:16 AM   Modules accepted: Orders

## 2014-11-28 ENCOUNTER — Other Ambulatory Visit: Payer: Self-pay

## 2014-11-28 DIAGNOSIS — E785 Hyperlipidemia, unspecified: Secondary | ICD-10-CM

## 2015-01-08 ENCOUNTER — Other Ambulatory Visit: Payer: Self-pay | Admitting: Cardiovascular Disease

## 2015-03-14 ENCOUNTER — Other Ambulatory Visit: Payer: Self-pay | Admitting: Cardiovascular Disease

## 2015-03-28 ENCOUNTER — Other Ambulatory Visit: Payer: Self-pay | Admitting: Cardiovascular Disease

## 2015-05-08 ENCOUNTER — Other Ambulatory Visit: Payer: Federal, State, Local not specified - PPO

## 2015-06-28 ENCOUNTER — Other Ambulatory Visit: Payer: Self-pay | Admitting: Cardiovascular Disease

## 2015-07-11 ENCOUNTER — Other Ambulatory Visit (INDEPENDENT_AMBULATORY_CARE_PROVIDER_SITE_OTHER): Payer: Federal, State, Local not specified - PPO | Admitting: *Deleted

## 2015-07-11 DIAGNOSIS — E785 Hyperlipidemia, unspecified: Secondary | ICD-10-CM | POA: Diagnosis not present

## 2015-07-11 LAB — HEPATIC FUNCTION PANEL
ALK PHOS: 65 U/L (ref 39–117)
ALT: 28 U/L (ref 0–35)
AST: 24 U/L (ref 0–37)
Albumin: 4.4 g/dL (ref 3.5–5.2)
BILIRUBIN DIRECT: 0.1 mg/dL (ref 0.0–0.3)
TOTAL PROTEIN: 7 g/dL (ref 6.0–8.3)
Total Bilirubin: 0.8 mg/dL (ref 0.2–1.2)

## 2015-07-11 LAB — LIPID PANEL
CHOL/HDL RATIO: 3
CHOLESTEROL: 176 mg/dL (ref 0–200)
HDL: 56.2 mg/dL (ref >39.00)
LDL Cholesterol: 83 mg/dL (ref 0–99)
NonHDL: 120.2
TRIGLYCERIDES: 185 mg/dL — AB (ref 0.0–149.0)
VLDL: 37 mg/dL (ref 0.0–40.0)

## 2015-07-16 NOTE — Progress Notes (Signed)
Patient ID: Natalie Hensley, female   DOB: 08/24/1956, 59 y.o.   MRN: [id number] This is a 59 y.o. patient had an MI in [place] followed by a CABG on 07/27/13 with a LIMA to the LAD, SVG to the RCA, and SVG to the diagonal 1. EF was normal. Postop course was complicated by sternal wound infection treated with antibiotics and wound VAC.   Had some SSCP in 2015   02/24/14  Small apical scar no ischemia EF 76%   No further pain.  Needs new nitro Should come off plavix.  Due to have lipid and liver in October with Dr Larose Hires   Seeing therapist for anxiety.  Has neuropathic pain and small keloid at sternotomy sight   ROS: Denies fever, malais, weight loss, blurry vision, decreased visual acuity, cough, sputum, SOB, hemoptysis, pleuritic pain, palpitaitons, heartburn, abdominal pain, melena, lower extremity edema, claudication, or rash.  All other systems reviewed and negative  General: Affect appropriate Healthy:  appears stated age 59: normal Neck supple with no adenopathy JVP normal no bruits no thyromegaly Lungs clear with no wheezing and good diaphragmatic motion Heart:  S1/S2 no murmur, no rub, gallop or click sternotomy with keloid formation  PMI normal Abdomen: benighn, BS positve, no tenderness, no AAA no bruit.  No HSM or HJR Distal pulses intact with no bruits No edema Neuro non-focal Skin warm and dry No muscular weakness   Current Outpatient Prescriptions  Medication Sig Dispense Refill  . ALPRAZolam (XANAX) 0.25 MG tablet Take 0.25 mg by mouth 3 (three) times daily as needed for sleep.    Marland Kitchen aspirin 81 MG tablet Take 81 mg by mouth daily.    Marland Kitchen levothyroxine (SYNTHROID, LEVOTHROID) 125 MCG tablet Take 125 mcg by mouth daily.  11  . metFORMIN (GLUCOPHAGE) 500 MG tablet Take 1 tablet by mouth 4 times a week    . metoprolol (LOPRESSOR) 50 MG tablet Take 12.5 mg by mouth 2 (two) times daily.  3  . nitroGLYCERIN (NITROSTAT) 0.4 MG SL tablet Place 0.4 mg under the tongue every  5 (five) minutes as needed for chest pain (no more than 3 doses).    Marland Kitchen omeprazole (PRILOSEC) 20 MG capsule Take 20 mg by mouth daily.    . simvastatin (ZOCOR) 40 MG tablet TAKE 1 TABLET BY MOUTH EVERY EVENING 30 tablet 0  . ZETIA 10 MG tablet TAKE 1 TABLET BY MOUTH EVERY DAY 90 tablet 2   No current facility-administered medications for this visit.    Allergies  Sulfa antibiotics  Electrocardiogram:  SR rate 96  Poor R wave progression cannot r/o anterior MI  07/17/15  SR rate 62  Normal   Assessment and Plan CAD/CABG:  07/27/13 CABG with LIMA to LAD, SVG to D1 and SVG RCA.  Non ischemic myovue 02/24/14  Continue asa and beta blocker Try capsacian cream for neuropathic pain  Chol:  Lab Results  Component Value Date   LDLCALC 83 07/11/2015   Continue statin Thyroid:  Stable on replacement  Labs with Dr Justin Mend DM:  Discussed low carb diet.  Target hemoglobin A1c is 6.5 or less.  Continue current medications. GERD:  Improved continue prilosec Anxiety:  F/u therapist  Ok to use HPTA and other natural remedies   Jenkins Rouge

## 2015-07-17 ENCOUNTER — Encounter: Payer: Self-pay | Admitting: Cardiovascular Disease

## 2015-07-18 ENCOUNTER — Ambulatory Visit (INDEPENDENT_AMBULATORY_CARE_PROVIDER_SITE_OTHER): Payer: Federal, State, Local not specified - PPO | Admitting: Cardiovascular Disease

## 2015-07-18 ENCOUNTER — Encounter: Payer: Self-pay | Admitting: Cardiovascular Disease

## 2015-07-18 VITALS — BP 130/70 | HR 62 | Ht 68.0 in | Wt 164.1 lb

## 2015-07-18 DIAGNOSIS — I2583 Coronary atherosclerosis due to lipid rich plaque: Principal | ICD-10-CM

## 2015-07-18 DIAGNOSIS — I2510 Atherosclerotic heart disease of native coronary artery without angina pectoris: Secondary | ICD-10-CM | POA: Diagnosis not present

## 2015-07-18 NOTE — Patient Instructions (Addendum)
Medication Instructions:  NO CHANGES  Labwork: NONE Testing/Procedures: NONE  Follow-Up: Your physician wants you to follow-up in: [place] will receive a reminder letter in the mail two months in advance. If you don't receive a letter, please call our office to schedule the follow-up appointment.  Any Other Special Instructions Will Be Listed Below (If Applicable).  TRY  CAPSAICIN  CREAM AS DIRECTED

## 2015-07-24 ENCOUNTER — Other Ambulatory Visit: Payer: Self-pay | Admitting: Cardiovascular Disease

## 2015-08-16 ENCOUNTER — Other Ambulatory Visit: Payer: Self-pay | Admitting: Cardiovascular Disease

## 2015-08-17 NOTE — Telephone Encounter (Signed)
Called [facility] and they already spoke with patient and have refill of simvastatin ready for pickup. Patient had called in old rx bottle.

## 2015-09-11 ENCOUNTER — Ambulatory Visit
Admission: RE | Admit: 2015-09-11 | Discharge: 2015-09-11 | Disposition: A | Discharge status: Home / Self Care | Payer: Federal, State, Local not specified - PPO | Source: Ambulatory Visit | Attending: Family Medicine | Admitting: Family Medicine

## 2015-09-11 DIAGNOSIS — Z1231 Encounter for screening mammogram for malignant neoplasm of breast: Secondary | ICD-10-CM

## 2015-10-13 ENCOUNTER — Other Ambulatory Visit: Payer: Self-pay | Admitting: Cardiovascular Disease

## 2015-10-23 ENCOUNTER — Other Ambulatory Visit (HOSPITAL_COMMUNITY): Payer: Self-pay | Admitting: Endocrinology

## 2015-10-23 DIAGNOSIS — C73 Malignant neoplasm of thyroid gland: Secondary | ICD-10-CM

## 2015-12-03 ENCOUNTER — Encounter (HOSPITAL_COMMUNITY)
Admission: RE | Admit: 2015-12-03 | Discharge: 2015-12-03 | Disposition: A | Discharge status: Home / Self Care | Payer: Federal, State, Local not specified - PPO | Source: Ambulatory Visit | Attending: Endocrinology | Admitting: Endocrinology

## 2015-12-03 DIAGNOSIS — C73 Malignant neoplasm of thyroid gland: Secondary | ICD-10-CM | POA: Insufficient documentation

## 2015-12-03 MED ORDER — THYROTROPIN ALFA 1.1 MG IM SOLR
INTRAMUSCULAR | Status: AC
  Filled 2015-12-03: qty 0.9

## 2015-12-03 MED ORDER — THYROTROPIN ALFA 1.1 MG IM SOLR
0.9000 mg | INTRAMUSCULAR | Status: AC
  Administered 2015-12-03: 0.9 mg via INTRAMUSCULAR

## 2015-12-04 ENCOUNTER — Encounter (HOSPITAL_COMMUNITY)
Admission: RE | Admit: 2015-12-04 | Discharge: 2015-12-04 | Disposition: A | Discharge status: Home / Self Care | Payer: Federal, State, Local not specified - PPO | Source: Ambulatory Visit | Attending: Endocrinology | Admitting: Endocrinology

## 2015-12-04 DIAGNOSIS — C73 Malignant neoplasm of thyroid gland: Secondary | ICD-10-CM | POA: Insufficient documentation

## 2015-12-04 MED ORDER — THYROTROPIN ALFA 1.1 MG IM SOLR
0.9000 mg | INTRAMUSCULAR | Status: AC
  Administered 2015-12-04: 0.9 mg via INTRAMUSCULAR

## 2015-12-04 MED ORDER — THYROTROPIN ALFA 1.1 MG IM SOLR
INTRAMUSCULAR | Status: AC
  Filled 2015-12-04: qty 0.9

## 2015-12-05 ENCOUNTER — Encounter (HOSPITAL_COMMUNITY)
Admission: RE | Admit: 2015-12-05 | Discharge: 2015-12-05 | Disposition: A | Discharge status: Home / Self Care | Payer: Federal, State, Local not specified - PPO | Source: Ambulatory Visit | Attending: Endocrinology | Admitting: Endocrinology

## 2015-12-05 DIAGNOSIS — C73 Malignant neoplasm of thyroid gland: Secondary | ICD-10-CM | POA: Insufficient documentation

## 2015-12-05 MED ORDER — SODIUM IODIDE I 131 CAPSULE
3.9700 | Freq: Once | INTRAVENOUS | Status: AC | PRN
  Administered 2015-12-05: 3.97 via ORAL

## 2015-12-07 ENCOUNTER — Encounter (HOSPITAL_COMMUNITY)
Admission: RE | Admit: 2015-12-07 | Discharge: 2015-12-07 | Disposition: A | Discharge status: Home / Self Care | Payer: Federal, State, Local not specified - PPO | Source: Ambulatory Visit | Attending: Endocrinology | Admitting: Endocrinology

## 2015-12-07 DIAGNOSIS — C73 Malignant neoplasm of thyroid gland: Secondary | ICD-10-CM | POA: Diagnosis present

## 2016-01-15 ENCOUNTER — Other Ambulatory Visit: Payer: Self-pay | Admitting: Cardiovascular Disease

## 2016-02-01 ENCOUNTER — Other Ambulatory Visit: Payer: Self-pay | Admitting: Cardiovascular Disease

## 2016-03-03 NOTE — Progress Notes (Signed)
Patient ID: Natalie Hensley, female   DOB: [birth year], 60 y.o.   MRN: [id number]   This is a 60 y.o. patient had an MI in [place] followed by a CABG on 07/27/13 with a LIMA to the LAD, SVG to the RCA, and SVG to the diagonal 1. EF was normal. Postop course was complicated by sternal wound infection treated with antibiotics and wound VAC.   Had some SSCP in 2015   02/24/14  Small apical scar no ischemia EF 76%   No further pain.  Needs new nitro Should come off plavix.  Due to have lipid and liver in October with Dr Larose Hires   Seeing therapist for anxiety. Better on meds  Stress as a Teaching laboratory technician at [employer]    Has neuropathic pain and small keloid at sternotomy sight   ROS: Denies fever, malais, weight loss, blurry vision, decreased visual acuity, cough, sputum, SOB, hemoptysis, pleuritic pain, palpitaitons, heartburn, abdominal pain, melena, lower extremity edema, claudication, or rash.  All other systems reviewed and negative  General: Affect appropriate Healthy:  appears stated age 19: normal Neck supple with no adenopathy JVP normal no bruits no thyromegaly Lungs clear with no wheezing and good diaphragmatic motion Heart:  S1/S2 no murmur, no rub, gallop or click sternotomy with keloid formation  PMI normal Abdomen: benighn, BS positve, no tenderness, no AAA no bruit.  No HSM or HJR Distal pulses intact with no bruits No edema Neuro non-focal Skin warm and dry No muscular weakness   Current Outpatient Prescriptions  Medication Sig Dispense Refill  . ALPRAZolam (XANAX) 0.25 MG tablet Take 0.25 mg by mouth 3 (three) times daily as needed for sleep.    Marland Kitchen aspirin 81 MG tablet Take 81 mg by mouth daily.    . busPIRone (BUSPAR) 10 MG tablet Take 10 mg by mouth 2 (two) times daily.    Marland Kitchen ezetimibe (ZETIA) 10 MG tablet TAKE 1 TABLET BY MOUTH EVERY DAY 90 tablet 1  . levothyroxine (SYNTHROID, LEVOTHROID) 125 MCG tablet Take 125 mcg by mouth daily.  11  . metFORMIN  (GLUCOPHAGE) 500 MG tablet Take 1 tablet by mouth 4 times a week    . metoprolol (LOPRESSOR) 50 MG tablet Take 12.5 mg by mouth 2 (two) times daily.  3  . NITROSTAT 0.4 MG SL tablet PLACE 1 TABLET UNDER TONGUE EVERY 5 MINUTES AS NEEDED FOR CHEST PAIN 25 tablet 1  . omeprazole (PRILOSEC) 20 MG capsule Take 20 mg by mouth daily.    . simvastatin (ZOCOR) 40 MG tablet TAKE 1 TABLET BY MOUTH EVERY EVENING 30 tablet 11  . ZETIA 10 MG tablet TAKE 1 TABLET BY MOUTH EVERY DAY 90 tablet 0  . nitrofurantoin, macrocrystal-monohydrate, (MACROBID) 100 MG capsule Take 1 capsule by mouth as directed.  11   No current facility-administered medications for this visit.    Allergies  Sulfa antibiotics  Electrocardiogram:  SR rate 96  Poor R wave progression cannot r/o anterior MI  07/17/15  SR rate 62  Normal   Assessment and Plan CAD/CABG:  07/27/13 CABG with LIMA to LAD, SVG to D1 and SVG RCA.  Non ischemic myovue 02/24/14  Continue asa and beta blocker Try capsacian cream for neuropathic pain  Chol:  Lab Results  Component Value Date   LDLCALC 83 07/11/2015   Continue statin Thyroid:  Stable on replacement  Labs with Dr Soyla Murphy  DM:  Discussed low carb diet.  Target hemoglobin A1c is 6.5 or less.  Continue current medications. GERD:  Improved continue prilosec Anxiety:  F/u therapist  Ok to use HPTA and other natural remedies  On Buspar now    [employer]

## 2016-03-04 ENCOUNTER — Encounter: Payer: Self-pay | Admitting: Cardiovascular Disease

## 2016-03-04 ENCOUNTER — Ambulatory Visit (INDEPENDENT_AMBULATORY_CARE_PROVIDER_SITE_OTHER): Payer: Federal, State, Local not specified - PPO | Admitting: Cardiovascular Disease

## 2016-03-04 VITALS — BP 114/68 | HR 68 | Ht 68.0 in | Wt 167.2 lb

## 2016-03-04 DIAGNOSIS — F320 Major depressive disorder, single episode, mild: Secondary | ICD-10-CM | POA: Diagnosis not present

## 2016-03-04 DIAGNOSIS — I2583 Coronary atherosclerosis due to lipid rich plaque: Principal | ICD-10-CM

## 2016-03-04 DIAGNOSIS — I2510 Atherosclerotic heart disease of native coronary artery without angina pectoris: Secondary | ICD-10-CM | POA: Diagnosis not present

## 2016-03-04 NOTE — Patient Instructions (Signed)

## 2016-04-02 DIAGNOSIS — F324 Major depressive disorder, single episode, in partial remission: Secondary | ICD-10-CM | POA: Diagnosis not present

## 2016-04-22 DIAGNOSIS — F324 Major depressive disorder, single episode, in partial remission: Secondary | ICD-10-CM | POA: Diagnosis not present

## 2016-05-20 DIAGNOSIS — E113291 Type 2 diabetes mellitus with mild nonproliferative diabetic retinopathy without macular edema, right eye: Secondary | ICD-10-CM | POA: Diagnosis not present

## 2016-05-20 DIAGNOSIS — E119 Type 2 diabetes mellitus without complications: Secondary | ICD-10-CM | POA: Diagnosis not present

## 2016-05-20 DIAGNOSIS — H2513 Age-related nuclear cataract, bilateral: Secondary | ICD-10-CM | POA: Diagnosis not present

## 2016-05-20 DIAGNOSIS — H3589 Other specified retinal disorders: Secondary | ICD-10-CM | POA: Diagnosis not present

## 2016-06-11 DIAGNOSIS — F324 Major depressive disorder, single episode, in partial remission: Secondary | ICD-10-CM | POA: Diagnosis not present

## 2016-07-07 ENCOUNTER — Other Ambulatory Visit: Payer: Self-pay | Admitting: Cardiovascular Disease

## 2016-07-09 DIAGNOSIS — F324 Major depressive disorder, single episode, in partial remission: Secondary | ICD-10-CM | POA: Diagnosis not present

## 2016-07-30 DIAGNOSIS — F324 Major depressive disorder, single episode, in partial remission: Secondary | ICD-10-CM | POA: Diagnosis not present

## 2016-09-10 DIAGNOSIS — Z0000 Encounter for general adult medical examination without abnormal findings: Secondary | ICD-10-CM | POA: Diagnosis not present

## 2016-09-10 DIAGNOSIS — R350 Frequency of micturition: Secondary | ICD-10-CM | POA: Diagnosis not present

## 2016-09-10 DIAGNOSIS — E782 Mixed hyperlipidemia: Secondary | ICD-10-CM | POA: Diagnosis not present

## 2016-09-10 DIAGNOSIS — E113291 Type 2 diabetes mellitus with mild nonproliferative diabetic retinopathy without macular edema, right eye: Secondary | ICD-10-CM | POA: Diagnosis not present

## 2016-09-11 ENCOUNTER — Other Ambulatory Visit: Payer: Federal, State, Local not specified - PPO

## 2016-09-11 ENCOUNTER — Other Ambulatory Visit: Payer: Self-pay | Admitting: Physical Medicine & Rehabilitation

## 2016-09-11 DIAGNOSIS — M25512 Pain in left shoulder: Secondary | ICD-10-CM

## 2016-09-15 ENCOUNTER — Other Ambulatory Visit: Payer: Self-pay | Admitting: Cardiovascular Disease

## 2016-09-17 DIAGNOSIS — F324 Major depressive disorder, single episode, in partial remission: Secondary | ICD-10-CM | POA: Diagnosis not present

## 2016-10-01 DIAGNOSIS — F324 Major depressive disorder, single episode, in partial remission: Secondary | ICD-10-CM | POA: Diagnosis not present

## 2016-10-15 DIAGNOSIS — J029 Acute pharyngitis, unspecified: Secondary | ICD-10-CM | POA: Diagnosis not present

## 2016-10-15 DIAGNOSIS — R6889 Other general symptoms and signs: Secondary | ICD-10-CM | POA: Diagnosis not present

## 2016-10-29 DIAGNOSIS — F324 Major depressive disorder, single episode, in partial remission: Secondary | ICD-10-CM | POA: Diagnosis not present

## 2016-12-03 DIAGNOSIS — F324 Major depressive disorder, single episode, in partial remission: Secondary | ICD-10-CM | POA: Diagnosis not present

## 2016-12-22 DIAGNOSIS — C73 Malignant neoplasm of thyroid gland: Secondary | ICD-10-CM | POA: Diagnosis not present

## 2016-12-22 DIAGNOSIS — E890 Postprocedural hypothyroidism: Secondary | ICD-10-CM | POA: Diagnosis not present

## 2016-12-29 DIAGNOSIS — E890 Postprocedural hypothyroidism: Secondary | ICD-10-CM | POA: Diagnosis not present

## 2016-12-29 DIAGNOSIS — C73 Malignant neoplasm of thyroid gland: Secondary | ICD-10-CM | POA: Diagnosis not present

## 2016-12-30 DIAGNOSIS — F324 Major depressive disorder, single episode, in partial remission: Secondary | ICD-10-CM | POA: Diagnosis not present

## 2017-01-21 DIAGNOSIS — F324 Major depressive disorder, single episode, in partial remission: Secondary | ICD-10-CM | POA: Diagnosis not present

## 2017-02-16 DIAGNOSIS — J069 Acute upper respiratory infection, unspecified: Secondary | ICD-10-CM | POA: Diagnosis not present

## 2017-02-16 DIAGNOSIS — R490 Dysphonia: Secondary | ICD-10-CM | POA: Diagnosis not present

## 2017-03-03 DIAGNOSIS — F324 Major depressive disorder, single episode, in partial remission: Secondary | ICD-10-CM | POA: Diagnosis not present

## 2017-03-16 ENCOUNTER — Other Ambulatory Visit: Payer: Self-pay | Admitting: Cardiovascular Disease

## 2017-03-28 ENCOUNTER — Other Ambulatory Visit: Payer: Self-pay | Admitting: Cardiovascular Disease

## 2017-03-31 DIAGNOSIS — F324 Major depressive disorder, single episode, in partial remission: Secondary | ICD-10-CM | POA: Diagnosis not present

## 2017-04-12 ENCOUNTER — Other Ambulatory Visit: Payer: Self-pay | Admitting: Cardiovascular Disease

## 2017-04-22 DIAGNOSIS — F324 Major depressive disorder, single episode, in partial remission: Secondary | ICD-10-CM | POA: Diagnosis not present

## 2017-04-28 ENCOUNTER — Other Ambulatory Visit: Payer: Self-pay | Admitting: Cardiovascular Disease

## 2017-04-29 ENCOUNTER — Other Ambulatory Visit: Payer: Self-pay | Admitting: Cardiovascular Disease

## 2017-05-20 DIAGNOSIS — F324 Major depressive disorder, single episode, in partial remission: Secondary | ICD-10-CM | POA: Diagnosis not present

## 2017-05-25 DIAGNOSIS — M25561 Pain in right knee: Secondary | ICD-10-CM | POA: Diagnosis not present

## 2017-05-25 DIAGNOSIS — M25562 Pain in left knee: Secondary | ICD-10-CM | POA: Diagnosis not present

## 2017-06-09 DIAGNOSIS — F324 Major depressive disorder, single episode, in partial remission: Secondary | ICD-10-CM | POA: Diagnosis not present

## 2017-06-22 ENCOUNTER — Other Ambulatory Visit: Payer: Self-pay | Admitting: Cardiovascular Disease

## 2017-06-23 NOTE — Progress Notes (Signed)
Patient ID: Natalie Hensley, female   DOB: 11/01/1956, 61 y.o.   MRN: [id number]   This is a 61 y.o. patient had an MI in [place] followed by a CABG on 07/27/13 with a LIMA to the LAD, SVG to the RCA, and SVG to the diagonal 1. EF was normal. Postop course was complicated by sternal wound infection treated with antibiotics and wound VAC.   Had some SSCP in 2015   02/24/14 Low risk myovue  Small apical scar no ischemia EF 76%   No further pain.  Needs new nitro   Seeing therapist for anxiety. 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[employer]    Has neuropathic pain and small keloid at sternotomy sight   She needs bilateral knee replacements with Dr Novella Olive. She has had one episode of chest pain relieved with one  Nitro   ROS: Denies fever, malais, weight loss, blurry vision, decreased visual acuity, cough, sputum, SOB, hemoptysis, pleuritic pain, palpitaitons, heartburn, abdominal pain, melena, lower extremity edema, claudication, or rash.  All other systems reviewed and negative  General: BP 130/80   Pulse 73   Ht 5\' 8"  (1.727 m)   Wt 181 lb 6 oz (82.3 kg)   SpO2 98%   BMI 27.58 kg/m  Affect appropriate Healthy:  appears stated age 61: normal Neck supple with no adenopathy JVP normal no bruits no thyromegaly Lungs clear with no wheezing and good diaphragmatic motion Heart:  S1/S2 no murmur, no rub, gallop or click PMI normal post sternotomy with keloid formation  Abdomen: benighn, BS positve, no tenderness, no AAA no bruit.  No HSM or HJR Distal pulses intact with no bruits No edema Neuro non-focal Skin warm and dry No muscular weakness    Current Outpatient Prescriptions  Medication Sig Dispense Refill  . ALPRAZolam (XANAX) 0.25 MG tablet Take 0.25 mg by mouth 3 (three) times daily as needed for sleep.    Marland Kitchen aspirin 81 MG tablet Take 81 mg by mouth daily.    . busPIRone (BUSPAR) 10 MG tablet Take 10 mg by mouth 2 (two) times daily.    Marland Kitchen ezetimibe (ZETIA) 10  MG tablet Take 1 tablet (10 mg total) by mouth daily. 90 tablet 3  . levothyroxine (SYNTHROID, LEVOTHROID) 125 MCG tablet Take 125 mcg by mouth daily.  11  . metFORMIN (GLUCOPHAGE) 500 MG tablet Take 1 tablet by mouth 4 times a week    . metoprolol (LOPRESSOR) 50 MG tablet Take 12.5 mg by mouth 2 (two) times daily.  3  . nitrofurantoin, macrocrystal-monohydrate, (MACROBID) 100 MG capsule Take 1 capsule by mouth as directed.  11  . nitroGLYCERIN (NITROSTAT) 0.4 MG SL tablet PLACE 1 TABLET UNDER TONGUE EVERY 5 MINUTES AS NEEDED FOR CHEST PAIN 25 tablet 3  . omeprazole (PRILOSEC) 20 MG capsule Take 20 mg by mouth daily.    . simvastatin (ZOCOR) 40 MG tablet Take one tablet by mouth every day in the evening. 90 tablet 3   No current facility-administered medications for this visit.     Allergies  Sulfa antibiotics  Electrocardiogram:  SR rate 96  Poor R wave progression cannot r/o anterior MI  07/17/15  SR rate 62  Normal  06/25/17 SR rate 73 LAE low voltage   Assessment and Plan CAD/CABG:  07/27/13 CABG with LIMA to LAD, SVG to D1 and SVG RCA.  Non ischemic myovue 02/24/14  Continue asa and beta blocker Preoperatively for knee surgery would do lexiscan myovue  to clear for surgery   Chol: update labs with primary continue statin  Lab Results  Component Value Date   LDLCALC 83 07/11/2015   Thyroid:  Stable on replacement  Labs with Dr Soyla Murphy  DM:  Discussed low carb diet.  Target hemoglobin A1c is 6.5 or less.  Continue current medications. GERD:  Improved continue prilosec Anxiety:  F/u therapist  Ok to use HPTA and other natural remedies  On Buspar now   Refills on cholesterol pills and nitro called in  Will clear for TKR left 07/21/17 if myovue low risk   Jenkins Rouge

## 2017-06-25 ENCOUNTER — Ambulatory Visit (INDEPENDENT_AMBULATORY_CARE_PROVIDER_SITE_OTHER): Payer: Federal, State, Local not specified - PPO | Admitting: Cardiovascular Disease

## 2017-06-25 ENCOUNTER — Encounter (INDEPENDENT_AMBULATORY_CARE_PROVIDER_SITE_OTHER): Payer: Self-pay

## 2017-06-25 VITALS — BP 130/80 | HR 73 | Ht 68.0 in | Wt 181.4 lb

## 2017-06-25 DIAGNOSIS — Z951 Presence of aortocoronary bypass graft: Secondary | ICD-10-CM

## 2017-06-25 DIAGNOSIS — Z01818 Encounter for other preprocedural examination: Secondary | ICD-10-CM

## 2017-06-25 MED ORDER — SIMVASTATIN 40 MG PO TABS: ORAL_TABLET | ORAL | 3 refills | Status: DC

## 2017-06-25 MED ORDER — NITROGLYCERIN 0.4 MG SL SUBL: SUBLINGUAL_TABLET | SUBLINGUAL | 3 refills | Status: DC

## 2017-06-25 MED ORDER — EZETIMIBE 10 MG PO TABS: 10.0000 mg | ORAL_TABLET | Freq: Every day | ORAL | 3 refills | Status: DC

## 2017-06-25 NOTE — Patient Instructions (Signed)
Medication Instructions:  1. REFILLS HAVE BEEN SENT IN FOR ZETIA, ZOCOR AND NTG  2. Your physician recommends that you continue on your current medications as directed. Please refer to the Current Medication list given to you today.   Labwork: NONE ORDERED   Testing/Procedures: Your physician has requested that you have a lexiscan myoview. For further information please visit [email or web address]. Please follow instruction sheet, as given.    Follow-Up: Your physician wants you to follow-up in: [place] H[place] will receive a reminder letter in the mail two months in advance. If you don't receive a letter, please call our office to schedule the follow-up appointment.   Any Other Special Instructions Will Be Listed Below (If Applicable).     If you need a refill on your cardiac medications before your next appointment, please call your pharmacy.

## 2017-06-29 ENCOUNTER — Telehealth (HOSPITAL_COMMUNITY): Payer: Self-pay | Admitting: *Deleted

## 2017-06-29 NOTE — Telephone Encounter (Signed)
Left message on voicemail per DPR in reference to upcoming appointment scheduled on 07/01/17 with detailed instructions given per Myocardial Perfusion Study Information Sheet for the test. LM to arrive 15 minutes early, and that it is imperative to arrive on time for appointment to keep from having the test rescheduled. If you need to cancel or reschedule your appointment, please call the office within 24 hours of your appointment. Failure to do so may result in a cancellation of your appointment, and a $50 no show fee. Phone number given for call back for any questions. Kirstie Peri

## 2017-06-30 ENCOUNTER — Telehealth (HOSPITAL_COMMUNITY): Payer: Self-pay | Admitting: Radiology

## 2017-06-30 NOTE — Telephone Encounter (Signed)
Patient given detailed instructions per Myocardial Perfusion Study Information Sheet for the test on 07/01/2017 at 7:15. Patient notified to arrive 15 minutes early and that it is imperative to arrive on time for appointment to keep from having the test rescheduled.  If you need to cancel or reschedule your appointment, please call the office within 24 hours of your appointment. . Patient verbalized understanding.EHK

## 2017-07-01 ENCOUNTER — Telehealth: Payer: Self-pay | Admitting: Cardiovascular Disease

## 2017-07-01 ENCOUNTER — Ambulatory Visit (HOSPITAL_COMMUNITY): Payer: Federal, State, Local not specified - PPO | Attending: Cardiology

## 2017-07-01 DIAGNOSIS — R079 Chest pain, unspecified: Secondary | ICD-10-CM | POA: Diagnosis not present

## 2017-07-01 DIAGNOSIS — I2510 Atherosclerotic heart disease of native coronary artery without angina pectoris: Secondary | ICD-10-CM | POA: Diagnosis not present

## 2017-07-01 DIAGNOSIS — E119 Type 2 diabetes mellitus without complications: Secondary | ICD-10-CM | POA: Diagnosis not present

## 2017-07-01 DIAGNOSIS — Z951 Presence of aortocoronary bypass graft: Secondary | ICD-10-CM | POA: Diagnosis not present

## 2017-07-01 DIAGNOSIS — Z01818 Encounter for other preprocedural examination: Secondary | ICD-10-CM | POA: Diagnosis not present

## 2017-07-01 LAB — MYOCARDIAL PERFUSION IMAGING
CHL CUP RESTING HR STRESS: 61 {beats}/min
CSEPPHR: 86 {beats}/min
LV dias vol: 74 mL (ref 46–106)
LVSYSVOL: 17 mL
RATE: 0.37
SDS: 4
SRS: 11
SSS: 15
TID: 1.37

## 2017-07-01 MED ORDER — TECHNETIUM TC 99M TETROFOSMIN IV KIT
32.6000 | PACK | Freq: Once | INTRAVENOUS | Status: AC | PRN
  Administered 2017-07-01: 32.6 via INTRAVENOUS
  Filled 2017-07-01: qty 33

## 2017-07-01 MED ORDER — TECHNETIUM TC 99M TETROFOSMIN IV KIT
10.2000 | PACK | Freq: Once | INTRAVENOUS | Status: AC | PRN
  Administered 2017-07-01: 10.2 via INTRAVENOUS
  Filled 2017-07-01: qty 11

## 2017-07-01 MED ORDER — REGADENOSON 0.4 MG/5ML IV SOLN
0.4000 mg | Freq: Once | INTRAVENOUS | Status: AC
  Administered 2017-07-01: 0.4 mg via INTRAVENOUS

## 2017-07-01 NOTE — Telephone Encounter (Signed)
Patient aware of stress test results. Will send copy to Dr. Rhona Raider at patient's request. Patient needed test for pre-op clearance.

## 2017-07-01 NOTE — Telephone Encounter (Signed)
New message   Pt states someone called and did not leave her a VM

## 2017-07-07 DIAGNOSIS — F324 Major depressive disorder, single episode, in partial remission: Secondary | ICD-10-CM | POA: Diagnosis not present

## 2017-07-10 NOTE — Pre-Procedure Instructions (Signed)
PHYLLISTINE DOMINGOS  07/10/2017      [id number] - [place], [address] [place] [address] Phone: [phone] Fax: [phone]    Your procedure is scheduled on Sept 11  Report to [facility] at 530 A.M.  Call this number if you have problems the morning of surgery:  [phone]   Remember:  Do not eat food or drink liquids after midnight.  Take these medicines the morning of surgery with A SIP OF WATER Alprazolam (Xanax) if needed, Buspirone (Buspar), Levothyroxine (Synthroid), Metoprolol (Lopressor), Nitrostat if needed, Omeprazole (Pril9osec)  Stop taking aspirin as directed by your Dr. Stop taking BC's, Goody's, Herbal medications, Fish Oil, Vitamins, Ibuprofen, Advil, Motrin, Aleve    How to Manage Your Diabetes Before and After Surgery  Why is it important to control my blood sugar before and after surgery? . Improving blood sugar levels before and after surgery helps healing and can limit problems. . A way of improving blood sugar control is eating a healthy diet by: o  Eating less sugar and carbohydrates o  Increasing activity/exercise o  Talking with your doctor about reaching your blood sugar goals . High blood sugars (greater than 180 mg/dL) can raise your risk of infections and slow your recovery, so you will need to focus on controlling your diabetes during the weeks before surgery. . Make sure that the doctor who takes care of your diabetes knows about your planned surgery including the date and location.  How do I manage my blood sugar before surgery? . Check your blood sugar at least 4 times a day, starting 2 days before surgery, to make sure that the level is not too high or low. o Check your blood sugar the morning of your surgery when you wake up and every 2 hours until you get to the Short Stay unit. . If your blood sugar is less than 70 mg/dL, you will need to treat for low blood  sugar: o Do not take insulin. o Treat a low blood sugar (less than 70 mg/dL) with  cup of clear juice (cranberry or apple), 4 glucose tablets, OR glucose gel. o Recheck blood sugar in 15 minutes after treatment (to make sure it is greater than 70 mg/dL). If your blood sugar is not greater than 70 mg/dL on recheck, call [phone] for further instructions. . Report your blood sugar to the short stay nurse when you get to Short Stay.  . If you are admitted to the hospital after surgery: o Your blood sugar will be checked by the staff and you will probably be given insulin after surgery (instead of oral diabetes medicines) to make sure you have good blood sugar levels. o The goal for blood sugar control after surgery is 80-180 mg/dL.         WHAT DO I DO ABOUT MY DIABETES MEDICATION?   Marland Kitchen Do not take oral diabetes medicines (pills) the morning of surgery. Metformin (Glucophage)      . If your CBG is greater than 220 mg/dL, you may take  of your sliding scale (correction) dose of insulin.  Other Instructions:          Patient Signature:  Date:   Nurse Signature:  Date:   Reviewed and Endorsed by [facility] Patient Education Committee, August 2015  Do not wear jewelry, make-up or nail polish.  Do not wear lotions, powders, or perfumes, or deoderant.  Do not shave 48  hours prior to surgery.  Men may shave face and neck.  Do not bring valuables to the hospital.  [facility] is not responsible for any belongings or valuables.  Contacts, dentures or bridgework may not be worn into surgery.  Leave your suitcase in the car.  After surgery it may be brought to your room.  For patients admitted to the hospital, discharge time will be determined by your treatment team.  Patients discharged the day of surgery will not be allowed to drive home.   \ Special instructions:  [place] - Preparing for Surgery  Before surgery, you can play an important role.  Because skin is not  sterile, your skin needs to be as free of germs as possible.  You can reduce the number of germs on you skin by washing with CHG (chlorahexidine gluconate) soap before surgery.  CHG is an antiseptic cleaner which kills germs and bonds with the skin to continue killing germs even after washing.  Please DO NOT use if you have an allergy to CHG or antibacterial soaps.  If your skin becomes reddened/irritated stop using the CHG and inform your nurse when you arrive at Short Stay.  Do not shave (including legs and underarms) for at least 48 hours prior to the first CHG shower.  You may shave your face.  Please follow these instructions carefully:   1.  Shower with CHG Soap the night before surgery and the   morning of Surgery.  2.  If you choose to wash your hair, wash your hair first as usual with your   normal shampoo.  3.  After you shampoo, rinse your hair and body thoroughly to remove the Shampoo.  4.  Use CHG as you would any other liquid soap.  You can apply chg directly  to the skin and wash gently with scrungie or a clean washcloth.  5.  Apply the CHG Soap to your body ONLY FROM THE NECK DOWN.   Do not use on open wounds or open sores.  Avoid contact with your eyes,  ears, mouth and genitals (private parts).  Wash genitals (private parts)  with your normal soap.  6.  Wash thoroughly, paying special attention to the area where your surgery   will be performed.  7.  Thoroughly rinse your body with warm water from the neck down.  8.  DO NOT shower/wash with your normal soap after using and rinsing off  the CHG Soap.  9.  Pat yourself dry with a clean towel.            10.  Wear clean pajamas.            11.  Place clean sheets on your bed the night of your first shower and do not sleep with pets.  Day of Surgery  Do not apply any lotions/deoderants the morning of surgery.  Please wear clean clothes to the hospital/surgery center.     Please read over the following fact sheets that you  were given. Pain Booklet, Coughing and Deep Breathing, MRSA Information and Surgical Site Infection Prevention

## 2017-07-14 ENCOUNTER — Encounter (HOSPITAL_COMMUNITY): Payer: Self-pay

## 2017-07-14 ENCOUNTER — Ambulatory Visit (HOSPITAL_COMMUNITY)
Admission: RE | Admit: 2017-07-14 | Discharge: 2017-07-14 | Disposition: A | Discharge status: Home / Self Care | Payer: Federal, State, Local not specified - PPO | Source: Ambulatory Visit | Attending: Orthopaedic Surgery | Admitting: Orthopaedic Surgery

## 2017-07-14 ENCOUNTER — Encounter (HOSPITAL_COMMUNITY)
Admission: RE | Admit: 2017-07-14 | Discharge: 2017-07-14 | Disposition: A | Discharge status: Home / Self Care | Payer: Federal, State, Local not specified - PPO | Source: Ambulatory Visit | Attending: Orthopaedic Surgery | Admitting: Orthopaedic Surgery

## 2017-07-14 DIAGNOSIS — Z951 Presence of aortocoronary bypass graft: Secondary | ICD-10-CM | POA: Diagnosis not present

## 2017-07-14 DIAGNOSIS — C73 Malignant neoplasm of thyroid gland: Secondary | ICD-10-CM | POA: Insufficient documentation

## 2017-07-14 DIAGNOSIS — I10 Essential (primary) hypertension: Secondary | ICD-10-CM | POA: Diagnosis not present

## 2017-07-14 DIAGNOSIS — I2510 Atherosclerotic heart disease of native coronary artery without angina pectoris: Secondary | ICD-10-CM | POA: Insufficient documentation

## 2017-07-14 DIAGNOSIS — E119 Type 2 diabetes mellitus without complications: Secondary | ICD-10-CM | POA: Diagnosis not present

## 2017-07-14 DIAGNOSIS — E785 Hyperlipidemia, unspecified: Secondary | ICD-10-CM | POA: Diagnosis not present

## 2017-07-14 DIAGNOSIS — Z01818 Encounter for other preprocedural examination: Secondary | ICD-10-CM | POA: Insufficient documentation

## 2017-07-14 DIAGNOSIS — K219 Gastro-esophageal reflux disease without esophagitis: Secondary | ICD-10-CM | POA: Insufficient documentation

## 2017-07-14 DIAGNOSIS — Z01812 Encounter for preprocedural laboratory examination: Secondary | ICD-10-CM | POA: Diagnosis not present

## 2017-07-14 HISTORY — DX: Gastro-esophageal reflux disease without esophagitis: K21.9

## 2017-07-14 HISTORY — DX: Unspecified osteoarthritis, unspecified site: M19.90

## 2017-07-14 HISTORY — DX: Atherosclerotic heart disease of native coronary artery without angina pectoris: I25.10

## 2017-07-14 LAB — CBC WITH DIFFERENTIAL/PLATELET
BASOS ABS: 0.1 10*3/uL (ref 0.0–0.1)
BASOS PCT: 1 %
EOS PCT: 6 %
Eosinophils Absolute: 0.4 10*3/uL (ref 0.0–0.7)
HCT: 44.9 % (ref 36.0–46.0)
Hemoglobin: 14.6 g/dL (ref 12.0–15.0)
Lymphocytes Relative: 29 %
Lymphs Abs: 1.8 10*3/uL (ref 0.7–4.0)
MCH: 30.4 pg (ref 26.0–34.0)
MCHC: 32.5 g/dL (ref 30.0–36.0)
MCV: 93.3 fL (ref 78.0–100.0)
MONO ABS: 0.7 10*3/uL (ref 0.1–1.0)
Monocytes Relative: 10 %
Neutro Abs: 3.5 10*3/uL (ref 1.7–7.7)
Neutrophils Relative %: 54 %
PLATELETS: 237 10*3/uL (ref 150–400)
RBC: 4.81 MIL/uL (ref 3.87–5.11)
RDW: 12.7 % (ref 11.5–15.5)
WBC: 6.4 10*3/uL (ref 4.0–10.5)

## 2017-07-14 LAB — TYPE AND SCREEN
ABO/RH(D): B NEG
Antibody Screen: NEGATIVE

## 2017-07-14 LAB — PROTIME-INR
INR: 0.91
Prothrombin Time: 12.2 seconds (ref 11.4–15.2)

## 2017-07-14 LAB — BASIC METABOLIC PANEL
Anion gap: 9 (ref 5–15)
BUN: 15 mg/dL (ref 6–20)
CALCIUM: 9.2 mg/dL (ref 8.9–10.3)
CO2: 27 mmol/L (ref 22–32)
CREATININE: 0.78 mg/dL (ref 0.44–1.00)
Chloride: 105 mmol/L (ref 101–111)
GFR calc Af Amer: >60 mL/min (ref >60)
GLUCOSE: 113 mg/dL — AB (ref 65–99)
Potassium: 4.1 mmol/L (ref 3.5–5.1)
Sodium: 141 mmol/L (ref 135–145)

## 2017-07-14 LAB — URINALYSIS, ROUTINE W REFLEX MICROSCOPIC
Bilirubin Urine: NEGATIVE
Glucose, UA: NEGATIVE mg/dL
HGB URINE DIPSTICK: NEGATIVE
Ketones, ur: NEGATIVE mg/dL
LEUKOCYTES UA: NEGATIVE
Nitrite: NEGATIVE
Protein, ur: NEGATIVE mg/dL
SPECIFIC GRAVITY, URINE: 1.014 (ref 1.005–1.030)
pH: 5 (ref 5.0–8.0)

## 2017-07-14 LAB — HEMOGLOBIN A1C
HEMOGLOBIN A1C: 6.2 % — AB (ref 4.8–5.6)
Mean Plasma Glucose: 131.24 mg/dL

## 2017-07-14 LAB — GLUCOSE, CAPILLARY: GLUCOSE-CAPILLARY: 121 mg/dL — AB (ref 65–99)

## 2017-07-14 LAB — APTT: APTT: 28 s (ref 24–36)

## 2017-07-14 LAB — SURGICAL PCR SCREEN
MRSA, PCR: NEGATIVE
STAPHYLOCOCCUS AUREUS: NEGATIVE

## 2017-07-14 LAB — ABO/RH: ABO/RH(D): B NEG

## 2017-07-14 NOTE — Progress Notes (Addendum)
PCP is Dr. Maurice Small Cardiologist is Dr Johnsie Cancel (saw him last 06-25-17) Denies any chest pain or fever. Reports a non productive cough she think is allergies.  Stress test noted from 06-2017 Reports fasting CBG's run in the 120's and that last A1c was 6.1.

## 2017-07-15 NOTE — Progress Notes (Signed)
Anesthesia Chart Review:  Pt is a 61 year old female scheduled for L total knee arthroplasty on 07/21/2017 with Melrose Nakayama, MD  - PCP is Maurice Small, MD - Cardiologist is Jenkins Rouge, MD; last office visit 06/25/17.   PMH includes:  CAD (s/p CABG 2014), HTN, DM, hyperlipidemia, thyroid cancer (s/p RAI 2011), GERD. Never smoker. BMI 27.5  Medications include: ASA 81mg , zetia, levothyroxine, metformin, metoprolol, prilosec, simvastatin  BP 140/73   Pulse 60   Temp 36.6 C   Resp 18   Ht 5\' 8"  (1.727 m)   Wt 181 lb 1.6 oz (82.1 kg)   SpO2 99%   BMI 27.54 kg/m   Preoperative labs reviewed.  HbA1c 6.2, glucose 113.   CXR 07/14/17: No active cardiopulmonary disease.  EKG 06/25/17: NSR. LA enlargement. Low voltage QRS. Nonspecific ST abnormality  Nuclear stress test 07/01/17:   Nuclear stress EF: 77%.  There was no ST segment deviation noted during stress.  Findings consistent with prior myocardial infarction.  This is a low risk study.  The left ventricular ejection fraction is hyperdynamic (>65%). Distal antero apical infarct no ischemia Wall motion normal EF 77%  No ischemia  If no changes, I anticipate pt can proceed with surgery as scheduled.   Willeen Cass, FNP-BC [facility] Short Stay Surgical Center/Anesthesiology Phone: [phone] 07/15/2017 12:24 PM

## 2017-07-20 MED ORDER — TRANEXAMIC ACID 1000 MG/10ML IV SOLN
2000.0000 mg | INTRAVENOUS | Status: DC
  Filled 2017-07-20: qty 20

## 2017-07-20 NOTE — Anesthesia Preprocedure Evaluation (Addendum)
Anesthesia Evaluation  Patient identified by MRN, date of birth, ID band Patient awake    Reviewed: Allergy & Precautions, H&P , Patient's Chart, lab work & pertinent test results  Airway Mallampati: II  TM Distance: >3 FB Neck ROM: full    Dental no notable dental hx.    Pulmonary    Pulmonary exam normal breath sounds clear to auscultation       Cardiovascular Exercise Tolerance: Good hypertension,  Rhythm:regular Rate:Normal     Neuro/Psych    GI/Hepatic   Endo/Other  diabetes  Renal/GU      Musculoskeletal   Abdominal   Peds  Hematology   Anesthesia Other Findings CAD   Normal EF DM  Reproductive/Obstetrics                             Anesthesia Physical Anesthesia Plan  ASA: II  Anesthesia Plan: Spinal   Post-op Pain Management:    Induction:   PONV Risk Score and Plan: 2 and Ondansetron and Dexamethasone  Airway Management Planned:   Additional Equipment:   Intra-op Plan:   Post-operative Plan:   Informed Consent: I have reviewed the patients History and Physical, chart, labs and discussed the procedure including the risks, benefits and alternatives for the proposed anesthesia with the patient or authorized representative who has indicated his/her understanding and acceptance.     Plan Discussed with:   Anesthesia Plan Comments: (  )       Anesthesia Quick Evaluation

## 2017-07-20 NOTE — H&P (Signed)
TOTAL KNEE ADMISSION H&P  Patient is being admitted for left total knee arthroplasty.  Subjective:  Chief Complaint:left knee pain.  HPI: Natalie Hensley, 61 y.o. female, has a history of pain and functional disability in the left knee due to arthritis and has failed non-surgical conservative treatments for greater than 12 weeks to includeNSAID's and/or analgesics, corticosteriod injections, viscosupplementation injections, flexibility and strengthening excercises, use of assistive devices, weight reduction as appropriate and activity modification.  Onset of symptoms was gradual, starting 5 years ago with gradually worsening course since that time. The patient noted prior procedures on the knee to include  I&D on the left knee(s).  Patient currently rates pain in the left knee(s) at 10 out of 10 with activity. Patient has night pain, worsening of pain with activity and weight bearing, pain that interferes with activities of daily living, crepitus and joint swelling.  Patient has evidence of subchondral cysts, subchondral sclerosis, periarticular osteophytes and joint space narrowing by imaging studies. There is no active infection.  Patient Active Problem List   Diagnosis Date Noted  . Chest pain 02/22/2014  . Myocardial infarction ([place])   . Hyperlipidemia   . HTN (hypertension)   . Urinary frequency   . Depression   . Thyroid cancer ([place])   . Anxiety   . Diabetes ([place])   . Goiter   . Hypothyroidism   . Carpal tunnel syndrome    Past Medical History:  Diagnosis Date  . Anxiety   . Arthritis   . Carpal tunnel syndrome   . Coronary artery disease   . Depression   . Diabetes ([place])   . GERD (gastroesophageal reflux disease)   . Goiter   . HTN (hypertension)   . Hyperlipidemia   . Hypothyroidism   . Myocardial infarction ([place])   . Thyroid cancer ([place])   . Urinary frequency     Past Surgical History:  Procedure Laterality Date  . bone infection  Left    left shin surgery by Dr  Rhona Raider  . CHOLECYSTECTOMY    . COLONOSCOPY    . CORONARY ARTERY BYPASS GRAFT  07/27/2013  . LAPAROSCOPIC APPENDECTOMY    . TOTAL ABDOMINAL HYSTERECTOMY      No prescriptions prior to admission.   Allergies  Allergen Reactions  . Sulfa Antibiotics Rash    Social History  Substance Use Topics  . Smoking status: Never Smoker  . Smokeless tobacco: Never Used  . Alcohol use Yes     Comment: social    Family History  Problem Relation Age of Onset  . CAD Father   . Hypertension Father   . Heart disease Father   . Diabetes Brother   . Cancer Brother   . Cancer Mother   . Cancer Sister   . Hypertension Sister   . Heart disease Paternal Grandmother   . Hyperlipidemia Paternal Grandmother   . Heart failure Paternal Grandmother   . Coronary artery disease Paternal Grandfather   . Alzheimer's disease Maternal Grandmother   . Heart attack Maternal Grandfather      Review of Systems  Musculoskeletal: Positive for joint pain.       Left knee  All other systems reviewed and are negative.   Objective:  Physical Exam  Constitutional: She is oriented to person, place, and time. She appears well-developed and well-nourished.  HENT:  Head: Normocephalic and atraumatic.  Eyes: Pupils are equal, round, and reactive to light.  Neck: Normal range of motion.  Cardiovascular: Normal rate.  Respiratory: Effort normal.  GI: Soft.  Musculoskeletal:  Both knees move about 5-120.  On the left she has an anteromedial scar from her old bone abscess procedure more than 10 years back.  She has no effusion.  There is some crepitation and medial joint line pain.  She has mild varus deformities.  Hip motion is full on both sides and straight leg raise is negative.  Sensation and motor function are intact in her feet with palpable pulses on both sides.  Neurological: She is alert and oriented to person, place, and time.  Skin: Skin is warm and dry.  Psychiatric: She has a normal mood and affect.  Her behavior is normal. Judgment and thought content normal.    Vital signs in last 24 hours:    Labs:   Estimated body mass index is 27.54 kg/m as calculated from the following:   Height as of 07/14/17: 5\' 8"  (1.727 m).   Weight as of 07/14/17: 82.1 kg (181 lb 1.6 oz).   Imaging Review Plain radiographs demonstrate severe degenerative joint disease of the left knee(s). The overall alignment isneutral. The bone quality appears to be good for age and reported activity level.  Assessment/Plan:  End stage primary arthritis, left knee   The patient history, physical examination, clinical judgment of the provider and imaging studies are consistent with end stage degenerative joint disease of the left knee(s) and total knee arthroplasty is deemed medically necessary. The treatment options including medical management, injection therapy arthroscopy and arthroplasty were discussed at length. The risks and benefits of total knee arthroplasty were presented and reviewed. The risks due to aseptic loosening, infection, stiffness, patella tracking problems, thromboembolic complications and other imponderables were discussed. The patient acknowledged the explanation, agreed to proceed with the plan and consent was signed. Patient is being admitted for inpatient treatment for surgery, pain control, PT, OT, prophylactic antibiotics, VTE prophylaxis, progressive ambulation and ADL's and discharge planning. The patient is planning to be discharged home with home health services

## 2017-07-21 ENCOUNTER — Encounter (HOSPITAL_COMMUNITY)
Admission: RE | Disposition: A | Discharge status: Home Health | Payer: Self-pay | Source: Ambulatory Visit | Attending: Orthopaedic Surgery

## 2017-07-21 ENCOUNTER — Inpatient Hospital Stay (HOSPITAL_COMMUNITY): Payer: Federal, State, Local not specified - PPO | Admitting: Emergency Medicine

## 2017-07-21 ENCOUNTER — Inpatient Hospital Stay (HOSPITAL_COMMUNITY)
Admission: RE | Admit: 2017-07-21 | Discharge: 2017-07-23 | DRG: 470 | Disposition: A | Discharge status: Home Health | Payer: Federal, State, Local not specified - PPO | Source: Ambulatory Visit | Attending: Orthopaedic Surgery | Admitting: Orthopaedic Surgery

## 2017-07-21 ENCOUNTER — Inpatient Hospital Stay (HOSPITAL_COMMUNITY): Payer: Federal, State, Local not specified - PPO | Admitting: Anesthesiology

## 2017-07-21 DIAGNOSIS — I10 Essential (primary) hypertension: Secondary | ICD-10-CM | POA: Diagnosis not present

## 2017-07-21 DIAGNOSIS — E785 Hyperlipidemia, unspecified: Secondary | ICD-10-CM | POA: Diagnosis not present

## 2017-07-21 DIAGNOSIS — Z8249 Family history of ischemic heart disease and other diseases of the circulatory system: Secondary | ICD-10-CM | POA: Diagnosis not present

## 2017-07-21 DIAGNOSIS — E119 Type 2 diabetes mellitus without complications: Secondary | ICD-10-CM | POA: Diagnosis not present

## 2017-07-21 DIAGNOSIS — I2510 Atherosclerotic heart disease of native coronary artery without angina pectoris: Secondary | ICD-10-CM | POA: Diagnosis present

## 2017-07-21 DIAGNOSIS — Z951 Presence of aortocoronary bypass graft: Secondary | ICD-10-CM

## 2017-07-21 DIAGNOSIS — Z882 Allergy status to sulfonamides status: Secondary | ICD-10-CM | POA: Diagnosis not present

## 2017-07-21 DIAGNOSIS — K219 Gastro-esophageal reflux disease without esophagitis: Secondary | ICD-10-CM | POA: Diagnosis present

## 2017-07-21 DIAGNOSIS — M1712 Unilateral primary osteoarthritis, left knee: Secondary | ICD-10-CM | POA: Diagnosis not present

## 2017-07-21 DIAGNOSIS — E039 Hypothyroidism, unspecified: Secondary | ICD-10-CM | POA: Diagnosis present

## 2017-07-21 DIAGNOSIS — Z90710 Acquired absence of both cervix and uterus: Secondary | ICD-10-CM | POA: Diagnosis not present

## 2017-07-21 DIAGNOSIS — I252 Old myocardial infarction: Secondary | ICD-10-CM

## 2017-07-21 DIAGNOSIS — G8918 Other acute postprocedural pain: Secondary | ICD-10-CM | POA: Diagnosis not present

## 2017-07-21 HISTORY — DX: Other specified postprocedural states: Z98.890

## 2017-07-21 HISTORY — DX: Adverse effect of unspecified anesthetic, initial encounter: T41.45XA

## 2017-07-21 HISTORY — DX: Family history of other specified conditions: Z84.89

## 2017-07-21 HISTORY — PX: TOTAL KNEE ARTHROPLASTY: SHX125

## 2017-07-21 HISTORY — DX: Other complications of anesthesia, initial encounter: T88.59XA

## 2017-07-21 HISTORY — DX: Nausea with vomiting, unspecified: R11.2

## 2017-07-21 LAB — GLUCOSE, CAPILLARY
GLUCOSE-CAPILLARY: 124 mg/dL — AB (ref 65–99)
GLUCOSE-CAPILLARY: 160 mg/dL — AB (ref 65–99)
GLUCOSE-CAPILLARY: 173 mg/dL — AB (ref 65–99)
Glucose-Capillary: 134 mg/dL — ABNORMAL HIGH (ref 65–99)
Glucose-Capillary: 189 mg/dL — ABNORMAL HIGH (ref 65–99)

## 2017-07-21 SURGERY — ARTHROPLASTY, KNEE, TOTAL
Anesthesia: Spinal | Site: Knee | Laterality: Left

## 2017-07-21 MED ORDER — CEFUROXIME SODIUM 1.5 G IV SOLR
INTRAVENOUS | Status: DC | PRN
  Administered 2017-07-21: 1.5 g via INTRAVENOUS

## 2017-07-21 MED ORDER — TRANEXAMIC ACID 1000 MG/10ML IV SOLN
INTRAVENOUS | Status: AC | PRN
  Administered 2017-07-21: 2000 mg via TOPICAL

## 2017-07-21 MED ORDER — MIDAZOLAM HCL 2 MG/2ML IJ SOLN
INTRAMUSCULAR | Status: DC | PRN
Start: 2017-07-21 — End: 2017-07-21
  Administered 2017-07-21: 1 mg via INTRAVENOUS
  Administered 2017-07-21: 2 mg via INTRAVENOUS
  Administered 2017-07-21: 1 mg via INTRAVENOUS

## 2017-07-21 MED ORDER — BUPIVACAINE-EPINEPHRINE (PF) 0.25% -1:200000 IJ SOLN
INTRAMUSCULAR | Status: AC
  Filled 2017-07-21: qty 30

## 2017-07-21 MED ORDER — PHENYLEPHRINE HCL 10 MG/ML IJ SOLN
INTRAVENOUS | Status: DC | PRN
  Administered 2017-07-21: 25 ug/min via INTRAVENOUS

## 2017-07-21 MED ORDER — LACTATED RINGERS IV SOLN: INTRAVENOUS | Status: DC

## 2017-07-21 MED ORDER — METHOCARBAMOL 500 MG PO TABS
500.0000 mg | ORAL_TABLET | Freq: Four times a day (QID) | ORAL | Status: DC | PRN
  Administered 2017-07-21 – 2017-07-22 (×2): 500 mg via ORAL
  Filled 2017-07-21 (×3): qty 1

## 2017-07-21 MED ORDER — FENTANYL CITRATE (PF) 250 MCG/5ML IJ SOLN
INTRAMUSCULAR | Status: AC
  Filled 2017-07-21: qty 5

## 2017-07-21 MED ORDER — HYDROMORPHONE HCL 1 MG/ML IJ SOLN
0.5000 mg | INTRAMUSCULAR | Status: DC | PRN
  Administered 2017-07-21 – 2017-07-22 (×2): 1 mg via INTRAVENOUS
  Filled 2017-07-21 (×3): qty 1

## 2017-07-21 MED ORDER — LEVOTHYROXINE SODIUM 112 MCG PO TABS
112.0000 ug | ORAL_TABLET | Freq: Every day | ORAL | Status: DC
  Administered 2017-07-22 – 2017-07-23 (×2): 112 ug via ORAL
  Filled 2017-07-21 (×2): qty 1

## 2017-07-21 MED ORDER — BUPIVACAINE-EPINEPHRINE (PF) 0.5% -1:200000 IJ SOLN
INTRAMUSCULAR | Status: DC | PRN
  Administered 2017-07-21: 30 mL

## 2017-07-21 MED ORDER — BUSPIRONE HCL 10 MG PO TABS
10.0000 mg | ORAL_TABLET | Freq: Two times a day (BID) | ORAL | Status: DC
  Administered 2017-07-21 – 2017-07-23 (×4): 10 mg via ORAL
  Filled 2017-07-21 (×4): qty 1

## 2017-07-21 MED ORDER — MIDAZOLAM HCL 2 MG/2ML IJ SOLN
INTRAMUSCULAR | Status: AC
  Filled 2017-07-21: qty 2

## 2017-07-21 MED ORDER — METFORMIN HCL 500 MG PO TABS
500.0000 mg | ORAL_TABLET | ORAL | Status: DC
  Administered 2017-07-22: 500 mg via ORAL
  Filled 2017-07-21 (×2): qty 1

## 2017-07-21 MED ORDER — CEFAZOLIN SODIUM-DEXTROSE 2-4 GM/100ML-% IV SOLN
2.0000 g | INTRAVENOUS | Status: AC
  Administered 2017-07-21: 2 g via INTRAVENOUS

## 2017-07-21 MED ORDER — EZETIMIBE 10 MG PO TABS
10.0000 mg | ORAL_TABLET | Freq: Every day | ORAL | Status: DC
  Administered 2017-07-21 – 2017-07-23 (×3): 10 mg via ORAL
  Filled 2017-07-21 (×3): qty 1

## 2017-07-21 MED ORDER — ONDANSETRON HCL 4 MG/2ML IJ SOLN
INTRAMUSCULAR | Status: DC | PRN
  Administered 2017-07-21: 4 mg via INTRAVENOUS

## 2017-07-21 MED ORDER — ALUM & MAG HYDROXIDE-SIMETH 200-200-20 MG/5ML PO SUSP: 30.0000 mL | ORAL | Status: DC | PRN

## 2017-07-21 MED ORDER — DEXTROSE 5 % IV SOLN
500.0000 mg | Freq: Four times a day (QID) | INTRAVENOUS | Status: DC | PRN
  Filled 2017-07-21: qty 5

## 2017-07-21 MED ORDER — SIMVASTATIN 40 MG PO TABS
40.0000 mg | ORAL_TABLET | Freq: Every day | ORAL | Status: DC
Start: 2017-07-21 — End: 2017-07-23
  Administered 2017-07-21 – 2017-07-22 (×2): 40 mg via ORAL
  Filled 2017-07-21 (×2): qty 1

## 2017-07-21 MED ORDER — METOCLOPRAMIDE HCL 5 MG/ML IJ SOLN
5.0000 mg | Freq: Three times a day (TID) | INTRAMUSCULAR | Status: DC | PRN
  Administered 2017-07-21 – 2017-07-22 (×2): 10 mg via INTRAVENOUS
  Filled 2017-07-21 (×2): qty 2

## 2017-07-21 MED ORDER — TRANEXAMIC ACID 1000 MG/10ML IV SOLN
1000.0000 mg | Freq: Once | INTRAVENOUS | Status: AC
  Administered 2017-07-21: 1000 mg via INTRAVENOUS
  Filled 2017-07-21: qty 10

## 2017-07-21 MED ORDER — DEXAMETHASONE SODIUM PHOSPHATE 10 MG/ML IJ SOLN
INTRAMUSCULAR | Status: DC | PRN
  Administered 2017-07-21: 10 mg via INTRAVENOUS

## 2017-07-21 MED ORDER — HEPARIN SODIUM (PORCINE) 1000 UNIT/ML IJ SOLN
INTRAMUSCULAR | Status: AC
  Filled 2017-07-21: qty 2

## 2017-07-21 MED ORDER — HYDROMORPHONE HCL 1 MG/ML IJ SOLN: 0.2500 mg | INTRAMUSCULAR | Status: DC | PRN

## 2017-07-21 MED ORDER — LACTATED RINGERS IV SOLN
INTRAVENOUS | Status: DC
  Administered 2017-07-21 (×2): via INTRAVENOUS

## 2017-07-21 MED ORDER — DIPHENHYDRAMINE HCL 12.5 MG/5ML PO ELIX: 12.5000 mg | ORAL_SOLUTION | ORAL | Status: DC | PRN

## 2017-07-21 MED ORDER — ONDANSETRON HCL 4 MG PO TABS: 4.0000 mg | ORAL_TABLET | Freq: Four times a day (QID) | ORAL | Status: DC | PRN

## 2017-07-21 MED ORDER — ASPIRIN EC 325 MG PO TBEC
325.0000 mg | DELAYED_RELEASE_TABLET | Freq: Two times a day (BID) | ORAL | Status: DC
  Administered 2017-07-22 – 2017-07-23 (×3): 325 mg via ORAL
  Filled 2017-07-21 (×3): qty 1

## 2017-07-21 MED ORDER — SODIUM CHLORIDE 0.9 % IJ SOLN
INTRAMUSCULAR | Status: DC | PRN
  Administered 2017-07-21: 30 mL

## 2017-07-21 MED ORDER — ACETAMINOPHEN 325 MG PO TABS: 650.0000 mg | ORAL_TABLET | Freq: Four times a day (QID) | ORAL | Status: DC | PRN

## 2017-07-21 MED ORDER — ACETAMINOPHEN 650 MG RE SUPP: 650.0000 mg | Freq: Four times a day (QID) | RECTAL | Status: DC | PRN

## 2017-07-21 MED ORDER — CEFAZOLIN SODIUM-DEXTROSE 2-4 GM/100ML-% IV SOLN
INTRAVENOUS | Status: AC
  Filled 2017-07-21: qty 100

## 2017-07-21 MED ORDER — CEFAZOLIN SODIUM-DEXTROSE 2-4 GM/100ML-% IV SOLN
2.0000 g | Freq: Four times a day (QID) | INTRAVENOUS | Status: AC
  Administered 2017-07-21 (×2): 2 g via INTRAVENOUS
  Filled 2017-07-21 (×2): qty 100

## 2017-07-21 MED ORDER — HYDROCODONE-ACETAMINOPHEN 5-325 MG PO TABS
1.0000 | ORAL_TABLET | ORAL | Status: DC | PRN
  Administered 2017-07-21 – 2017-07-23 (×12): 2 via ORAL
  Filled 2017-07-21 (×12): qty 2

## 2017-07-21 MED ORDER — ONDANSETRON HCL 4 MG/2ML IJ SOLN
4.0000 mg | Freq: Four times a day (QID) | INTRAMUSCULAR | Status: DC | PRN
  Administered 2017-07-21: 4 mg via INTRAVENOUS
  Filled 2017-07-21: qty 2

## 2017-07-21 MED ORDER — PROPOFOL 10 MG/ML IV BOLUS
INTRAVENOUS | Status: AC
  Filled 2017-07-21: qty 20

## 2017-07-21 MED ORDER — BUPIVACAINE-EPINEPHRINE (PF) 0.5% -1:200000 IJ SOLN
INTRAMUSCULAR | Status: AC
  Filled 2017-07-21: qty 30

## 2017-07-21 MED ORDER — BISACODYL 5 MG PO TBEC: 5.0000 mg | DELAYED_RELEASE_TABLET | Freq: Every day | ORAL | Status: DC | PRN

## 2017-07-21 MED ORDER — INSULIN ASPART 100 UNIT/ML ~~LOC~~ SOLN
0.0000 [IU] | Freq: Three times a day (TID) | SUBCUTANEOUS | Status: DC
  Administered 2017-07-21 (×2): 3 [IU] via SUBCUTANEOUS

## 2017-07-21 MED ORDER — PANTOPRAZOLE SODIUM 40 MG PO TBEC
40.0000 mg | DELAYED_RELEASE_TABLET | Freq: Every day | ORAL | Status: DC
  Administered 2017-07-21 – 2017-07-23 (×3): 40 mg via ORAL
  Filled 2017-07-21 (×3): qty 1

## 2017-07-21 MED ORDER — BUPIVACAINE LIPOSOME 1.3 % IJ SUSP
INTRAMUSCULAR | Status: DC | PRN
  Administered 2017-07-21: 20 mL

## 2017-07-21 MED ORDER — CEFUROXIME SODIUM 1.5 G IV SOLR
INTRAVENOUS | Status: AC
  Filled 2017-07-21: qty 1.5

## 2017-07-21 MED ORDER — METOCLOPRAMIDE HCL 5 MG PO TABS: 5.0000 mg | ORAL_TABLET | Freq: Three times a day (TID) | ORAL | Status: DC | PRN

## 2017-07-21 MED ORDER — 0.9 % SODIUM CHLORIDE (POUR BTL) OPTIME
TOPICAL | Status: DC | PRN
  Administered 2017-07-21: 1000 mL

## 2017-07-21 MED ORDER — PROPOFOL 500 MG/50ML IV EMUL
INTRAVENOUS | Status: DC | PRN
  Administered 2017-07-21: 50 ug/kg/min via INTRAVENOUS

## 2017-07-21 MED ORDER — SODIUM CHLORIDE 0.9 % IR SOLN
Status: DC | PRN
  Administered 2017-07-21: 3000 mL

## 2017-07-21 MED ORDER — PHENOL 1.4 % MT LIQD
1.0000 | OROMUCOSAL | Status: DC | PRN
Start: 2017-07-21 — End: 2017-07-23

## 2017-07-21 MED ORDER — PROTAMINE SULFATE 10 MG/ML IV SOLN
INTRAVENOUS | Status: AC
  Filled 2017-07-21: qty 5

## 2017-07-21 MED ORDER — BUPIVACAINE-EPINEPHRINE (PF) 0.5% -1:200000 IJ SOLN
INTRAMUSCULAR | Status: DC | PRN
  Administered 2017-07-21: 2 mL via PERINEURAL

## 2017-07-21 MED ORDER — FENTANYL CITRATE (PF) 250 MCG/5ML IJ SOLN
INTRAMUSCULAR | Status: DC | PRN
  Administered 2017-07-21 (×5): 50 ug via INTRAVENOUS

## 2017-07-21 MED ORDER — CHLORHEXIDINE GLUCONATE 4 % EX LIQD: 60.0000 mL | Freq: Once | CUTANEOUS | Status: DC

## 2017-07-21 MED ORDER — PHENYLEPHRINE 40 MCG/ML (10ML) SYRINGE FOR IV PUSH (FOR BLOOD PRESSURE SUPPORT)
PREFILLED_SYRINGE | INTRAVENOUS | Status: AC
  Filled 2017-07-21: qty 10

## 2017-07-21 MED ORDER — SODIUM CHLORIDE 0.9 % IV SOLN
1000.0000 mg | INTRAVENOUS | Status: AC
  Administered 2017-07-21: 1000 mg via INTRAVENOUS
  Filled 2017-07-21: qty 10

## 2017-07-21 MED ORDER — METOPROLOL TARTRATE 12.5 MG HALF TABLET
12.5000 mg | ORAL_TABLET | Freq: Two times a day (BID) | ORAL | Status: DC
  Administered 2017-07-21 – 2017-07-23 (×4): 12.5 mg via ORAL
  Filled 2017-07-21 (×4): qty 1

## 2017-07-21 MED ORDER — BUPIVACAINE LIPOSOME 1.3 % IJ SUSP
20.0000 mL | Freq: Once | INTRAMUSCULAR | Status: DC
  Filled 2017-07-21: qty 20

## 2017-07-21 MED ORDER — MENTHOL 3 MG MT LOZG: 1.0000 | LOZENGE | OROMUCOSAL | Status: DC | PRN

## 2017-07-21 MED ORDER — ALPRAZOLAM 0.25 MG PO TABS: 0.1250 mg | ORAL_TABLET | Freq: Every day | ORAL | Status: DC | PRN

## 2017-07-21 MED ORDER — NITROGLYCERIN 0.4 MG SL SUBL: 0.4000 mg | SUBLINGUAL_TABLET | SUBLINGUAL | Status: DC | PRN

## 2017-07-21 MED ORDER — DOCUSATE SODIUM 100 MG PO CAPS
100.0000 mg | ORAL_CAPSULE | Freq: Two times a day (BID) | ORAL | Status: DC
  Administered 2017-07-21 – 2017-07-23 (×4): 100 mg via ORAL
  Filled 2017-07-21 (×4): qty 1

## 2017-07-21 SURGICAL SUPPLY — 54 items
BAG DECANTER FOR FLEXI CONT (MISCELLANEOUS) ×1 IMPLANT
BANDAGE ACE 6X5 VEL STRL LF (GAUZE/BANDAGES/DRESSINGS) ×1 IMPLANT
BANDAGE ESMARK 6X9 LF (GAUZE/BANDAGES/DRESSINGS) ×1 IMPLANT
BLADE SAGITTAL 25.0X1.19X90 (BLADE) ×1 IMPLANT
BLADE SAW SGTL 13.0X1.19X90.0M (BLADE) ×1 IMPLANT
BNDG CMPR 9X6 STRL LF SNTH (GAUZE/BANDAGES/DRESSINGS) ×1
BNDG CMPR MED 10X6 ELC LF (GAUZE/BANDAGES/DRESSINGS) ×1
BNDG ELASTIC 6X10 VLCR STRL LF (GAUZE/BANDAGES/DRESSINGS) ×2 IMPLANT
BNDG ESMARK 6X9 LF (GAUZE/BANDAGES/DRESSINGS) ×2
BOWL SMART MIX CTS (DISPOSABLE) ×2 IMPLANT
CAPT KNEE TOTAL 3 ATTUNE ×1 IMPLANT
CEMENT HV SMART SET (Cement) ×4 IMPLANT
CLSR STERI-STRIP ANTIMIC 1/2X4 (GAUZE/BANDAGES/DRESSINGS) ×1 IMPLANT
COVER SURGICAL LIGHT HANDLE (MISCELLANEOUS) ×2 IMPLANT
CUFF TOURNIQUET SINGLE 34IN LL (TOURNIQUET CUFF) ×2 IMPLANT
CUFF TOURNIQUET SINGLE 44IN (TOURNIQUET CUFF) IMPLANT
DECANTER SPIKE VIAL GLASS SM (MISCELLANEOUS) ×2 IMPLANT
DRAPE EXTREMITY T 121X128X90 (DRAPE) ×2 IMPLANT
DRAPE HALF SHEET 40X57 (DRAPES) ×4 IMPLANT
DRAPE U-SHAPE 47X51 STRL (DRAPES) ×2 IMPLANT
DRSG AQUACEL AG ADV 3.5X10 (GAUZE/BANDAGES/DRESSINGS) ×2 IMPLANT
DURAPREP 26ML APPLICATOR (WOUND CARE) ×2 IMPLANT
ELECT REM PT RETURN 9FT ADLT (ELECTROSURGICAL) ×2
ELECTRODE REM PT RTRN 9FT ADLT (ELECTROSURGICAL) ×1 IMPLANT
GLOVE BIO SURGEON STRL SZ8 (GLOVE) ×4 IMPLANT
GLOVE BIOGEL PI IND STRL 8 (GLOVE) ×2 IMPLANT
GLOVE BIOGEL PI INDICATOR 8 (GLOVE) ×2
GOWN STRL REUS W/ TWL LRG LVL3 (GOWN DISPOSABLE) ×1 IMPLANT
GOWN STRL REUS W/ TWL XL LVL3 (GOWN DISPOSABLE) ×2 IMPLANT
GOWN STRL REUS W/TWL LRG LVL3 (GOWN DISPOSABLE) ×2
GOWN STRL REUS W/TWL XL LVL3 (GOWN DISPOSABLE) ×4
HANDPIECE INTERPULSE COAX TIP (DISPOSABLE) ×2
HOOD PEEL AWAY FACE SHEILD DIS (HOOD) ×4 IMPLANT
IMMOBILIZER KNEE 22 UNIV (SOFTGOODS) ×2 IMPLANT
KIT BASIN OR (CUSTOM PROCEDURE TRAY) ×2 IMPLANT
KIT ROOM TURNOVER OR (KITS) ×2 IMPLANT
MANIFOLD NEPTUNE II (INSTRUMENTS) ×2 IMPLANT
NDL HYPO 21X1 ECLIPSE (NEEDLE) ×1 IMPLANT
NEEDLE HYPO 21X1 ECLIPSE (NEEDLE) ×2 IMPLANT
NS IRRIG 1000ML POUR BTL (IV SOLUTION) ×2 IMPLANT
PACK TOTAL JOINT (CUSTOM PROCEDURE TRAY) ×2 IMPLANT
PAD ARMBOARD 7.5X6 YLW CONV (MISCELLANEOUS) ×4 IMPLANT
SET HNDPC FAN SPRY TIP SCT (DISPOSABLE) ×1 IMPLANT
STRIP CLOSURE SKIN 1/2X4 (GAUZE/BANDAGES/DRESSINGS) ×2 IMPLANT
SUT VIC AB 0 CT1 27 (SUTURE) ×2
SUT VIC AB 0 CT1 27XBRD ANBCTR (SUTURE) ×1 IMPLANT
SUT VIC AB 2-0 CT1 27 (SUTURE) ×2
SUT VIC AB 2-0 CT1 TAPERPNT 27 (SUTURE) ×1 IMPLANT
SUT VIC AB 3-0 FS2 27 (SUTURE) ×2 IMPLANT
SUT VLOC 180 0 24IN GS25 (SUTURE) ×2 IMPLANT
SYR 50ML LL SCALE MARK (SYRINGE) ×2 IMPLANT
TOWEL OR 17X24 6PK STRL BLUE (TOWEL DISPOSABLE) ×2 IMPLANT
TOWEL OR 17X26 10 PK STRL BLUE (TOWEL DISPOSABLE) ×2 IMPLANT
TRAY CATH 16FR W/PLASTIC CATH (SET/KITS/TRAYS/PACK) IMPLANT

## 2017-07-21 NOTE — Evaluation (Signed)
Physical Therapy Evaluation Patient Details Name: Natalie Hensley MRN: [id number] DOB: 1956/08/16 Today's Date: 07/21/2017   History of Present Illness  Pt is a 61 y/o female s/p elective L TKA. PMH includes MI, HTN, depression, DM, CAD, and carpal tunnel.   Clinical Impression  Pt s/p surgery above with deficits below. PTA, pt was independent with functional mobility. Eval limited this session, as pt wanting to eat when tray arrived. Was able to perform bed mobility with supervision this session. Will reattempt to see pt later in afternoon to progress mobility. Pt reports she will be staying at a friends house and will have assist 24/7. Has all necessary DME and follow up per MD arrangements. Will continue to follow acutely to maximize functional mobility independence and safety.     Follow Up Recommendations DC plan and follow up therapy as arranged by surgeon;Supervision for mobility/OOB    Equipment Recommendations  None recommended by PT (Has all necessary DME )    Recommendations for Other Services       Precautions / Restrictions Precautions Precautions: Knee Precaution Booklet Issued: Yes (comment) Precaution Comments: Exercise to be reviewed in next session.  Required Braces or Orthoses: Knee Immobilizer - Left Knee Immobilizer - Left: Other (comment) (until discontinued ) Restrictions Weight Bearing Restrictions: Yes LLE Weight Bearing: Weight bearing as tolerated      Mobility  Bed Mobility Overal bed mobility: Needs Assistance Bed Mobility: Supine to Sit;Rolling Rolling: Supervision   Supine to sit: Supervision     General bed mobility comments: Supervision for safety.   Transfers                 General transfer comment: Requesting to eat lunch sitting EOB upon arrival and wanting to get pain meds. RN in room. Will attempt transfers in next session.   Ambulation/Gait                Stairs            Wheelchair Mobility    Modified  [place] (Stroke Patients Only)       Balance Overall balance assessment: Needs assistance Sitting-balance support: No upper extremity supported;Feet supported Sitting balance-Leahy Scale: Good                                       Pertinent Vitals/Pain Pain Assessment: 0-10 Pain Score: 6  Pain Location: L knee  Pain Descriptors / Indicators: Aching;Operative site guarding Pain Intervention(s): Limited activity within patient's tolerance;Monitored during session;Repositioned    Home Living Family/patient expects to be discharged to:: Private residence Living Arrangements: Non-relatives/Friends Available Help at Discharge: Friend(s);Available 24 hours/day Type of Home: Other(Comment) (condo ) Home Access: Stairs to enter Entrance Stairs-Rails: None Entrance Stairs-Number of Steps: 2 Home Layout: One level Home Equipment: Walker - 2 wheels;Bedside commode      Prior Function Level of Independence: Independent               Hand Dominance        Extremity/Trunk Assessment   Upper Extremity Assessment Upper Extremity Assessment: Defer to OT evaluation    Lower Extremity Assessment Lower Extremity Assessment: LLE deficits/detail LLE Deficits / Details: Sensory in tact. Deficits consistent with post op pain and weakness.    Cervical / Trunk Assessment Cervical / Trunk Assessment: Normal  Communication   Communication: No difficulties  Cognition Arousal/Alertness: Awake/alert Behavior During Therapy: WFL for tasks  assessed/performed Overall Cognitive Status: Within Functional Limits for tasks assessed                                        General Comments General comments (skin integrity, edema, etc.): Pt requesting to eat once tray delivered, therefore further mobility deferred. Will reattempt later in afternoon to progress mobility.     Exercises     Assessment/Plan    PT Assessment    PT Problem List         PT  Treatment Interventions      PT Goals (Current goals can be found in the Care Plan section)  Acute Rehab PT Goals Patient Stated Goal: to go home  PT Goal Formulation: With patient Time For Goal Achievement: 07/28/17 Potential to Achieve Goals: Good    Frequency     Barriers to discharge        Co-evaluation               AM-PAC PT "6 Clicks" Daily Activity  Outcome Measure Difficulty turning over in bed (including adjusting bedclothes, sheets and blankets)?: None Difficulty moving from lying on back to sitting on the side of the bed? : None Difficulty sitting down on and standing up from a chair with arms (e.g., wheelchair, bedside commode, etc,.)?: Unable Help needed moving to and from a bed to chair (including a wheelchair)?: A Little Help needed walking in hospital room?: A Little Help needed climbing 3-5 steps with a railing? : A Little 6 Click Score: 18    End of Session   Activity Tolerance: Patient tolerated treatment well Patient left: in bed;with call bell/phone within reach;with nursing/sitter in room (sitting EOB ) Nurse Communication: Mobility status;Other (comment) (Dressing leaking )      Time: 1761-6073 PT Time Calculation (min) (ACUTE ONLY): 16 min   Charges:   PT Evaluation $PT Eval Low Complexity: 1 Low     PT G Codes:        Leighton Ruff, PT, DPT  Acute Rehabilitation Services  Pager: [phone]   Rudean Hitt 07/21/2017, 2:51 PM

## 2017-07-21 NOTE — Anesthesia Procedure Notes (Signed)
Spinal  Patient location during procedure: OR Staffing Anesthesiologist: Lyndle Herrlich Spinal Block Patient position: sitting Prep: DuraPrep Patient monitoring: heart rate, blood pressure and continuous pulse ox Approach: right paramedian Location: L3-4 Injection technique: single-shot Needle Needle type: Sprotte  Needle gauge: 24 G Needle length: 9 cm Assessment Sensory level: T4 Additional Notes Spinal Dosage in OR  .75% Bupivicaine ml       1.8

## 2017-07-21 NOTE — Care Management Note (Addendum)
Case Management Note  Patient Details  Name: AOI KOUNS MRN: [id number] Date of Birth: 1956/01/04  Subjective/Objective:                 Spoke to patient at the bedisde. She states that her sister will deliver her RW and 3/1 to her tomorrow to take home. Patient will be going to friend's house at [place]. Address will be [place] [phone]. Patient is pre-op linked to [facility], patient is agreeable to use [facility], referral placed to [facility], clinical liaison.  Addendum- COM machine will be delivered to home after DC.    Action/Plan:  DC to home w [place].  Expected Discharge Date:                  Expected Discharge Plan:  [place]  In-House Referral:     Discharge planning Services  CM Consult  Post Acute Care Choice:  Home Health Choice offered to:  Patient  DME Arranged:    DME Agency:     HH Arranged:  PT [place]:  [place]  Status of Service:  In process, will continue to follow  If discussed at Long Length of Stay Meetings, dates discussed:    Additional Comments:  Carles Collet, RN 07/21/2017, 2:24 PM

## 2017-07-21 NOTE — Progress Notes (Signed)
07/21/17 1520  PT Treatment   Last PT Received On 07/21/17  Assistance Needed +1  History of Present Illness Pt is a 61 y/o female s/p elective L TKA. PMH includes MI, HTN, depression, DM, CAD, and carpal tunnel.   Subjective Data  Patient Stated Goal to go home   Precautions  Precautions Knee  Precaution Booklet Issued Yes (comment)  Precaution Comments Reviewed supine ther ex with pt.   Required Braces or Orthoses Knee Immobilizer - Left  Knee Immobilizer - Left Other (comment) (until discontinued )  Restrictions  Weight Bearing Restrictions Yes  LLE Weight Bearing WBAT  Pain Assessment  Pain Assessment Faces  Faces Pain Scale 2  Pain Location L knee   Pain Descriptors / Indicators Aching;Operative site guarding  Pain Intervention(s) Monitored during session;Limited activity within patient's tolerance;Repositioned  Cognition  Arousal/Alertness Awake/alert  Behavior During Therapy WFL for tasks assessed/performed  Overall Cognitive Status Within Functional Limits for tasks assessed  Bed Mobility  Overal bed mobility Needs Assistance  Bed Mobility Supine to Sit;Sit to Supine  Supine to sit Supervision  Sit to supine Supervision  General bed mobility comments Supervision for safety. Pt reporting dizziness upon sitting.   Transfers  Overall transfer level Needs assistance  Equipment used Rolling walker (2 wheeled)  Transfers Sit to/from Stand  Sit to Stand Min guard  General transfer comment Min guard for safety. Verbal cues for safe hand placement. Upon standing, pt very dizzy and requesting to sit down. Further mobility deferred.   Ambulation/Gait  General Gait Details Deferred secodnary to dizziness.   Balance  Overall balance assessment Needs assistance  Sitting-balance support No upper extremity supported;Feet supported  Sitting balance-Leahy Scale Good  Standing balance support Bilateral upper extremity supported  Standing balance-Leahy Scale Poor  Standing balance  comment Reliant on UE support for balance.   General Comments  General comments (skin integrity, edema, etc.) Pt sister present during session.   Exercises  Exercises Total Joint  Total Joint Exercises  Ankle Circles/Pumps AROM;Both;20 reps  Quad Sets AROM;Left;10 reps  Towel Squeeze AROM;Both;10 reps  Heel Slides AROM;Left;10 reps  PT - End of Session  Equipment Utilized During Treatment Gait belt  Activity Tolerance Treatment limited secondary to medical complications (Comment) (dizziness )  Patient left in bed;with call bell/phone within reach;with family/visitor present  Nurse Communication Mobility status;Other (comment) (dizziness )  CPM Left Knee  CPM Left Knee Off  PT - Assessment/Plan  PT Plan Current plan remains appropriate  PT Visit Diagnosis Other abnormalities of gait and mobility (R26.89);Pain  Pain - Right/Left Left  Pain - part of body Knee  PT Frequency (ACUTE ONLY) 7X/week  Follow Up Recommendations DC plan and follow up therapy as arranged by surgeon;Supervision for mobility/OOB  PT equipment None recommended by PT  AM-PAC PT "6 Clicks" Daily Activity Outcome Measure  Difficulty turning over in bed (including adjusting bedclothes, sheets and blankets)? 4  Difficulty moving from lying on back to sitting on the side of the bed?  4  Difficulty sitting down on and standing up from a chair with arms (e.g., wheelchair, bedside commode, etc,.)? 1  Help needed moving to and from a bed to chair (including a wheelchair)? 3  Help needed walking in hospital room? 3  Help needed climbing 3-5 steps with a railing?  2  6 Click Score 17  Mobility G Code  CK  PT Goal Progression  Progress towards PT goals Progressing toward goals  Acute Rehab PT Goals  PT  Goal Formulation With patient  Time For Goal Achievement 07/28/17  Potential to Achieve Goals Good  PT Time Calculation  PT Start Time (ACUTE ONLY) 1458  PT Stop Time (ACUTE ONLY) 1518  PT Time Calculation (min)  (ACUTE ONLY) 20 min  PT General Charges  $$ ACUTE PT VISIT 1 Visit  PT Treatments  $Therapeutic Exercise 8-22 mins   Attempted to progress mobility after pt ate, however, pt very limited by dizziness. Performed sit<>stand transfer with min guard, however, pt reporting increased dizziness and requesting to sit down. Reviewed supine there ex with pt. Current recommendations appropriate. Will continue to follow acutely to maximize functional mobility independence and safety.   Leighton Ruff, PT, DPT  Acute Rehabilitation Services  Pager: [phone]

## 2017-07-21 NOTE — Transfer of Care (Signed)
Immediate Anesthesia Transfer of Care Note  Patient: Natalie Hensley  Procedure(s) Performed: Procedure(s): TOTAL KNEE ARTHROPLASTY (Left)  Patient Location: PACU  Anesthesia Type:Spinal  Level of Consciousness: awake and alert   Airway & Oxygen Therapy: Patient Spontanous Breathing and Patient connected to nasal cannula oxygen  Post-op Assessment: Report given to RN and Post -op Vital signs reviewed and stable  Post vital signs: Reviewed and stable  Last Vitals:  Vitals:   07/21/17 0720 07/21/17 0721  BP:  (!) 143/74  Pulse: 65 70  Resp: 12 14  Temp:    SpO2: 97% (!) 87%    Last Pain:  Vitals:   07/21/17 0603  TempSrc: Oral  PainSc: 5       Patients Stated Pain Goal: 2 (16/60/63 0160)  Complications: No apparent anesthesia complications

## 2017-07-21 NOTE — Anesthesia Procedure Notes (Signed)
Procedure Name: MAC Date/Time: 07/21/2017 7:00 AM Performed by: Mervyn Gay Pre-anesthesia Checklist: Patient identified, Patient being monitored, Timeout performed, Emergency Drugs available and Suction available Patient Re-evaluated:Patient Re-evaluated prior to induction Oxygen Delivery Method: Simple face mask and Nasal cannula Number of attempts: 1 Placement Confirmation: positive ETCO2 Dental Injury: Teeth and Oropharynx as per pre-operative assessment

## 2017-07-21 NOTE — Op Note (Signed)
PREOP DIAGNOSIS: DJD LEFT KNEE POSTOP DIAGNOSIS:  same PROCEDURE: LEFT TKR ANESTHESIA: Spinal and MAC ATTENDING SURGEON: Alga Southall G ASSISTANT: Loni Dolly PA  INDICATIONS FOR PROCEDURE: Natalie Hensley is a 61 y.o. female who has struggled for a long time with pain due to degenerative arthritis of the left knee.  The patient has failed many conservative non-operative measures and at this point has pain which limits the ability to sleep and walk.  The patient is offered total knee replacement.  Informed operative consent was obtained after discussion of possible risks of anesthesia, infection, neurovascular injury, DVT, and death.  The importance of the post-operative rehabilitation protocol to optimize result was stressed extensively with the patient.  SUMMARY OF FINDINGS AND PROCEDURE:  Natalie Hensley was taken to the operative suite where under the above anesthesia a left knee replacement was performed.  There were advanced degenerative changes and the bone quality was excellent.  We used the DePuyAttune system and placed size 5 femur, 5 tibia, 41 mm all polyethylene patella, and a size 10 mm spacer.  Loni Dolly PA-C assisted throughout and was invaluable to the completion of the case in that he helped retract and maintain exposure while I placed the components.  He also helped close thereby minimizing OR time.  The patient was admitted for appropriate post-op care to include perioperative antibiotics and mechanical and pharmacologic measures for DVT prophylaxis.  DESCRIPTION OF PROCEDURE:  Natalie Hensley was taken to the operative suite where the above anesthesia was applied.  The patient was positioned supine and prepped and draped in normal sterile fashion.  An appropriate time out was performed.  After the administration of kefzol pre-op antibiotic the leg was elevated and exsanguinated and a tourniquet inflated.  A standard longitudinal incision was made on the anterior knee.  Dissection was  carried down to the extensor mechanism.  All appropriate anti-infective measures were used including the pre-operative antibiotic, betadine impregnated drape, and closed hooded exhaust systems for each member of the surgical team.  A medial parapatellar incision was made in the extensor mechanism and the knee cap flipped and the knee flexed.  Some residual meniscal tissues were removed along with any remaining ACL/PCL tissue.  A guide was placed on the tibia and a flat cut was made on it's superior surface.  An intramedullary guide was placed in the femur and was utilized to make anterior and posterior cuts creating an appropriate flexion gap.  A second intramedullary guide was placed in the femur to make a distal cut properly balancing the knee with an extension gap equal to the flexion gap.  The three bones sized to the above mentioned sizes and the appropriate guides were placed and utilized.  A trial reduction was done and the knee easily came to full extension and the patella tracked well on flexion.  The trial components were removed and all bones were cleaned with pulsatile lavage and then dried thoroughly.  Cement was mixed and was pressurized onto the bones followed by placement of the aforementioned components.  Excess cement was trimmed and pressure was held on the components until the cement had hardened. I included Zinacef in the cement.  The tourniquet was deflated and a small amount of bleeding was controlled with cautery and pressure.  The knee was irrigated thoroughly.  The extensor mechanism was re-approximated with V-loc suture in running fashion.  The knee was flexed and the repair was solid.  The subcutaneous tissues were re-approximated with #0 and #  2-0 vicryl and the skin closed with a subcuticular stitch and steristrips.  A sterile dressing was applied.  Intraoperative fluids, EBL, and tourniquet time can be obtained from anesthesia records.  DISPOSITION:  The patient was taken to recovery  room in stable condition and admitted for appropriate post-op care to include peri-operative antibiotic and DVT prophylaxis with mechanical and pharmacologic measures.  Rossy Virag G 07/21/2017, 9:44 AM

## 2017-07-21 NOTE — Interval H&P Note (Signed)
History and Physical Interval Note:  07/21/2017 7:11 AM  Natalie Hensley  has presented today for surgery, with the diagnosis of LEFT KNEE OSTEOARTHRITIS  The various methods of treatment have been discussed with the patient and family. After consideration of risks, benefits and other options for treatment, the patient has consented to  Procedure(s): TOTAL KNEE ARTHROPLASTY (Left) as a surgical intervention .  The patient's history has been reviewed, patient examined, no change in status, stable for surgery.  I have reviewed the patient's chart and labs.  Questions were answered to the patient's satisfaction.     Juvencio Verdi G

## 2017-07-21 NOTE — Anesthesia Procedure Notes (Signed)
Anesthesia Regional Block: Adductor canal block   Pre-Anesthetic Checklist: ,, timeout performed, Correct Patient, Correct Site, Correct Laterality, Correct Procedure, Correct Position, site marked, Risks and benefits discussed, pre-op evaluation,  At surgeon's request and post-op pain management  Laterality: Left  Prep: chloraprep       Needles:   Needle Type: Echogenic Needle     Needle Length: 9cm  Needle Gauge: 21     Additional Needles:   Procedures: ultrasound guided,,,,,,,,  Narrative:  Start time: 07/21/2017 7:05 AM End time: 07/21/2017 7:10 AM Injection made incrementally with aspirations every 5 mL. Anesthesiologist: Lyndle Herrlich

## 2017-07-21 NOTE — Progress Notes (Signed)
Orthopedic Tech Progress Note Patient Details:  [name] August 26, 1956 [id number]  CPM Left Knee CPM Left Knee: On Left Knee Flexion (Degrees): 90 Left Knee Extension (Degrees): 0 Additional Comments: trapeze bar patient helper   Hildred Priest 07/21/2017, 10:47 AM Viewed order from doctor's order list

## 2017-07-22 ENCOUNTER — Encounter (HOSPITAL_COMMUNITY): Payer: Self-pay | Admitting: Orthopaedic Surgery

## 2017-07-22 LAB — GLUCOSE, CAPILLARY
GLUCOSE-CAPILLARY: 111 mg/dL — AB (ref 65–99)
GLUCOSE-CAPILLARY: 114 mg/dL — AB (ref 65–99)
GLUCOSE-CAPILLARY: 145 mg/dL — AB (ref 65–99)
Glucose-Capillary: 116 mg/dL — ABNORMAL HIGH (ref 65–99)

## 2017-07-22 NOTE — Therapy (Signed)
Occupational Therapy Evaluation and Discharge Patient Details Name: Natalie Hensley MRN: [id number] DOB: 05-16-1956 Today's Date: 07/22/2017    History of Present Illness Pt is a 61 y/o female s/p elective L TKA on 07/21/17. PMH includes MI, HTN, depression, DM, CAD, and carpal tunnel.    Clinical Impression   Pt reports being independent in ADLs PTA. Currently pt is overall at the supervision level for ADLs and transfers, and requires min guard during functional mobility at RW level for balance and safety. Pt educated on compensatory techniques to complete ADLs. Pt reports family/friends are available to provided 24 hour assistance as needed upon d/c. No acute OT needs at this time.     Follow Up Recommendations  No OT follow up;Supervision/Assistance - 24 hour    Equipment Recommendations  None recommended by OT (Pt has all needed DME.)    Recommendations for Other Services       Precautions / Restrictions Precautions Precautions: Knee Precaution Booklet Issued: No Precaution Comments: reviewed precautions Required Braces or Orthoses: Knee Immobilizer - Left Knee Immobilizer - Left: Other (comment) Restrictions Weight Bearing Restrictions: Yes LLE Weight Bearing: Weight bearing as tolerated      Mobility Bed Mobility               General bed mobility comments: Pt sitting in chair upon OT arrival. Pt reports that she is able to get her operated leg in and out of bed with increased time. Pt educated on compensatory techniques to complete bed mobility.  Transfers Overall transfer level: Needs assistance Equipment used: Rolling walker (2 wheeled) Transfers: Sit to/from Stand Sit to Stand: Supervision         General transfer comment: Stood x1 with RW from chair; no cues or physical assist required    Balance Overall balance assessment: Needs assistance Sitting-balance support: No upper extremity supported;Feet supported Sitting balance-Leahy Scale: Good Sitting  balance - Comments: Pt able to sit edge of chair and don socks with no LOB   Standing balance support: Bilateral upper extremity supported;During functional activity Standing balance-Leahy Scale: Fair Standing balance comment: Reliant on UE support for balance during dynamic standing tasks.                           ADL either performed or assessed with clinical judgement   ADL Overall ADL's : Needs assistance/impaired Eating/Feeding: Modified independent   Grooming: Supervision/safety;Set up;Sitting   Upper Body Bathing: Supervision/ safety;Set up;Sitting   Lower Body Bathing: Supervison/ safety;Set up;Sitting/lateral leans   Upper Body Dressing : Supervision/safety;Set up;Sitting   Lower Body Dressing: Supervision/safety;Set up;Sitting/lateral leans   Toilet Transfer: Supervision/safety;Ambulation;Comfort height toilet;RW   Toileting- Clothing Manipulation and Hygiene: Supervision/safety;Sitting/lateral lean   Tub/ Shower Transfer: Supervision/safety;Ambulation;Shower seat;Rolling walker   Functional mobility during ADLs: Min guard;Rolling walker General ADL Comments: Pt educated on compensatory strategies to compelte LB ADLs. Pt able to don socks with supervision for safety. Min guard during functional mobility for safety and balance.      Vision         Perception     Praxis      Pertinent Vitals/Pain Pain Assessment: 0-10 Pain Score: 6  Pain Location: L knee  Pain Descriptors / Indicators: Sore;Aching;Discomfort;Operative site guarding Pain Intervention(s): Limited activity within patient's tolerance;Monitored during session     Hand Dominance     Extremity/Trunk Assessment Upper Extremity Assessment Upper Extremity Assessment: Overall WFL for tasks assessed   Lower Extremity Assessment Lower  Extremity Assessment: Defer to PT evaluation   Cervical / Trunk Assessment Cervical / Trunk Assessment: Normal   Communication  Communication Communication: No difficulties   Cognition Arousal/Alertness: Awake/alert Behavior During Therapy: WFL for tasks assessed/performed Overall Cognitive Status: Within Functional Limits for tasks assessed                                     General Comments  Pt's family/friends in room for evaluation and participated in education. Family/friends agreeable to assist pt as needed upon d/c. Pt reports no concerns about going home.    Exercises    Shoulder Instructions      Home Living Family/patient expects to be discharged to:: Private residence Living Arrangements: Alone Available Help at Discharge: Friend(s);Available 24 hours/day Type of Home: Other(Comment) Home Access: Stairs to enter Entrance Stairs-Number of Steps: 2 Entrance Stairs-Rails: None Home Layout: One level     Bathroom Shower/Tub: Walk-in shower;Tub/shower unit   Bathroom Toilet: Standard Bathroom Accessibility: Yes How Accessible: Accessible via walker Home Equipment: [place] - 2 wheels;Bedside commode;Shower seat   Additional Comments: Pt plans to use walk in shower with a shower seat.      Prior Functioning/Environment Level of Independence: Independent                 OT Problem List:        OT Treatment/Interventions:      OT Goals(Current goals can be found in the care plan section) Acute Rehab OT Goals Patient Stated Goal: to go home   OT Frequency:     Barriers to D/C:            Co-evaluation              AM-PAC PT "6 Clicks" Daily Activity     Outcome Measure Help from another person eating meals?: None Help from another person taking care of personal grooming?: None Help from another person toileting, which includes using toliet, bedpan, or urinal?: None Help from another person bathing (including washing, rinsing, drying)?: None Help from another person to put on and taking off regular upper body clothing?: None Help from another person to  put on and taking off regular lower body clothing?: None 6 Click Score: 24   End of Session Equipment Utilized During Treatment: Gait belt;Rolling walker CPM Left Knee CPM Left Knee: Off Nurse Communication: Mobility status  Activity Tolerance: Patient tolerated treatment well Patient left: in chair;with call bell/phone within reach;with family/visitor present  OT Visit Diagnosis: Unsteadiness on feet (R26.81);Pain Pain - Right/Left: Left Pain - part of body: Knee                Time: 4496-7591 OT Time Calculation (min): 16 min Charges:  OT General Charges $OT Visit: 1 Visit OT Evaluation $OT Eval Moderate Complexity: 1 Mod G-Codes:     Boykin Peek, OTS [phone]   Boykin Peek 07/22/2017, 5:12 PM

## 2017-07-22 NOTE — Progress Notes (Signed)
Physical Therapy Treatment Patient Details Name: Natalie Hensley MRN: [id number] DOB: February 25, 1956 Today's Date: 07/22/2017    History of Present Illness Pt is a 61 y/o female s/p elective L TKA on 07/21/17. PMH includes MI, HTN, depression, DM, CAD, and carpal tunnel.    PT Comments    Pt progressing with mobility. Able to amb in room with RW and min guard for balance; further mobility limited secondary to pain and feelings of "uneasiness" with amb (RN aware). Reviewed positioning, precautions, and importance of mobility. Pt pleasant and motivated to continue working with therapy. Will continue to follow acutely.   Follow Up Recommendations  DC plan and follow up therapy as arranged by surgeon;Supervision for mobility/OOB     Equipment Recommendations  None recommended by PT (owns necessary DME)    Recommendations for Other Services       Precautions / Restrictions Precautions Precautions: Knee Precaution Booklet Issued: No Precaution Comments: Verbally reviewed precautions Restrictions Weight Bearing Restrictions: Yes LLE Weight Bearing: Weight bearing as tolerated    Mobility  Bed Mobility Overal bed mobility: Needs Assistance Bed Mobility: Supine to Sit     Supine to sit: Supervision;HOB elevated        Transfers Overall transfer level: Needs assistance Equipment used: Rolling walker (2 wheeled) Transfers: Sit to/from Stand Sit to Stand: Min guard         General transfer comment: Min guard for safety; cues for hand placement and technique with RW.   Ambulation/Gait Ambulation/Gait assistance: Min guard Ambulation Distance (Feet): 30 Feet Assistive device: Rolling walker (2 wheeled) Gait Pattern/deviations: Step-to pattern;Step-through pattern;Decreased weight shift to left;Antalgic;Decreased dorsiflexion - left Gait velocity: Decreased Gait velocity interpretation: <1.8 ft/sec, indicative of risk for recurrent falls General Gait Details: Slow, antalgic amb  in room with min guard for safety. Able to progress to step-through pattern with cues, but decreased heel-to-toe secondary to L knee soreness. 1x seated rest break secondary to "wooziness" which was relieved with rest and cold wash cloth on forehead.   Stairs            Wheelchair Mobility    Modified Rankin (Stroke Patients Only)       Balance Overall balance assessment: Needs assistance Sitting-balance support: No upper extremity supported;Feet supported Sitting balance-Leahy Scale: Good     Standing balance support: Bilateral upper extremity supported Standing balance-Leahy Scale: Poor Standing balance comment: Reliant on UE support for balance.                             Cognition Arousal/Alertness: Awake/alert Behavior During Therapy: WFL for tasks assessed/performed Overall Cognitive Status: Within Functional Limits for tasks assessed                                        Exercises      General Comments        Pertinent Vitals/Pain Pain Assessment: 0-10 Pain Score: 6  Pain Location: L knee  Pain Descriptors / Indicators: Aching;Operative site guarding Pain Intervention(s): Limited activity within patient's tolerance;Monitored during session;Ice applied    Home Living                      Prior Function            PT Goals (current goals can now be found in the care plan section)  Acute Rehab PT Goals Patient Stated Goal: to go home  PT Goal Formulation: With patient Time For Goal Achievement: 07/28/17 Potential to Achieve Goals: Good Progress towards PT goals: Progressing toward goals    Frequency    7X/week      PT Plan Current plan remains appropriate    Co-evaluation              AM-PAC PT "6 Clicks" Daily Activity  Outcome Measure  Difficulty turning over in bed (including adjusting bedclothes, sheets and blankets)?: None Difficulty moving from lying on back to sitting on the side of  the bed? : None Difficulty sitting down on and standing up from a chair with arms (e.g., wheelchair, bedside commode, etc,.)?: A Little Help needed moving to and from a bed to chair (including a wheelchair)?: A Little Help needed walking in hospital room?: A Little Help needed climbing 3-5 steps with a railing? : A Lot 6 Click Score: 19    End of Session Equipment Utilized During Treatment: Gait belt Activity Tolerance: Patient tolerated treatment well;Patient limited by fatigue Patient left: in chair;with call bell/phone within reach Nurse Communication: Mobility status PT Visit Diagnosis: Other abnormalities of gait and mobility (R26.89);Pain Pain - Right/Left: Left Pain - part of body: Knee     Time: 1572-6203 PT Time Calculation (min) (ACUTE ONLY): 19 min  Charges:  $Gait Training: 8-22 mins                    G Codes:      Mabeline Caras, PT, DPT Acute Rehab Services  Pager: [place] 07/22/2017, 10:36 AM

## 2017-07-22 NOTE — Anesthesia Postprocedure Evaluation (Signed)
Anesthesia Post Note  Patient: Natalie Hensley  Procedure(s) Performed: Procedure(s) (LRB): TOTAL KNEE ARTHROPLASTY (Left)     Patient location during evaluation: PACU Anesthesia Type: Spinal Level of consciousness: awake Pain management: satisfactory to patient Vital Signs Assessment: post-procedure vital signs reviewed and stable Respiratory status: spontaneous breathing Cardiovascular status: blood pressure returned to baseline Postop Assessment: no headache and spinal receding Anesthetic complications: no    Last Vitals:  Vitals:   07/22/17 0613 07/22/17 1300  BP: 136/72 125/71  Pulse: 79 79  Resp: 18 18  Temp: 37 C 36.9 C  SpO2: 94% 95%    Last Pain:  Vitals:   07/22/17 1300  TempSrc: Oral  PainSc:                  Riccardo Dubin

## 2017-07-22 NOTE — Progress Notes (Signed)
Physical Therapy Treatment Patient Details Name: Natalie Hensley MRN: [id number] DOB: 1956-01-29 Today's Date: 07/22/2017    History of Present Illness Pt is a 61 y/o female s/p elective L TKA on 07/21/17. PMH includes MI, HTN, depression, DM, CAD, and carpal tunnel.    PT Comments    Pt progressing well with mobility; remains motivated to participate. Amb 150' with RW and min guard for balance; transfers with RW and supervision. Completed seated therex and reviewed resting knee in ext. Will plan for stair training and car transfer simulation during tomorrow morning's treatment session. Will continue to follow acutely.   Follow Up Recommendations  DC plan and follow up therapy as arranged by surgeon;Supervision for mobility/OOB     Equipment Recommendations  None recommended by PT (owns all DME)    Recommendations for Other Services       Precautions / Restrictions Precautions Precautions: Knee Precaution Booklet Issued: No Precaution Comments: Verbally reviewed precautions Restrictions Weight Bearing Restrictions: Yes LLE Weight Bearing: Weight bearing as tolerated    Mobility  Bed Mobility               General bed mobility comments: Received sitting in char  Transfers Overall transfer level: Needs assistance Equipment used: Rolling walker (2 wheeled) Transfers: Sit to/from Stand Sit to Stand: Supervision         General transfer comment: Stood x2 with RW from chair and toilet; no cues or physical assist required  Ambulation/Gait Ambulation/Gait assistance: Min guard Ambulation Distance (Feet): 150 Feet Assistive device: Rolling walker (2 wheeled) Gait Pattern/deviations: Step-through pattern;Decreased stride length;Decreased weight shift to left;Ataxic Gait velocity: Decreased Gait velocity interpretation: <1.8 ft/sec, indicative of risk for recurrent falls General Gait Details: Amb with improved technique, requiring cues for heel-to-toe gait pattern in  order to maximize L knee ext. 1x standing rest break secondary to fatigue   Stairs            Wheelchair Mobility    Modified Rankin (Stroke Patients Only)       Balance Overall balance assessment: Needs assistance Sitting-balance support: No upper extremity supported;Feet supported Sitting balance-Leahy Scale: Good     Standing balance support: Bilateral upper extremity supported;No upper extremity supported;During functional activity Standing balance-Leahy Scale: Fair Standing balance comment: Able to stand at sink to brush teeth with no UE support and supervision                            Cognition Arousal/Alertness: Awake/alert Behavior During Therapy: WFL for tasks assessed/performed Overall Cognitive Status: Within Functional Limits for tasks assessed                                        Exercises Total Joint Exercises Ankle Circles/Pumps: AROM;Both;10 reps;Seated Long Arc Quad: AROM;Left;10 reps;Seated Knee Flexion: AAROM;Left;10 reps;Seated;Other (comment) (with washcloth under foot)    General Comments General comments (skin integrity, edema, etc.): Sister and friend present throughout session      Pertinent Vitals/Pain Pain Assessment: 0-10 Pain Score: 5  Pain Location: L knee  Pain Descriptors / Indicators: Operative site guarding;Discomfort;Sore Pain Intervention(s): Limited activity within patient's tolerance;Monitored during session    Home Living                      Prior Function  PT Goals (current goals can now be found in the care plan section) Acute Rehab PT Goals Patient Stated Goal: to go home  PT Goal Formulation: With patient Time For Goal Achievement: 07/28/17 Potential to Achieve Goals: Good Progress towards PT goals: Progressing toward goals    Frequency    7X/week      PT Plan Current plan remains appropriate    Co-evaluation              AM-PAC PT "6  Clicks" Daily Activity  Outcome Measure  Difficulty turning over in bed (including adjusting bedclothes, sheets and blankets)?: None Difficulty moving from lying on back to sitting on the side of the bed? : None Difficulty sitting down on and standing up from a chair with arms (e.g., wheelchair, bedside commode, etc,.)?: A Little Help needed moving to and from a bed to chair (including a wheelchair)?: A Little Help needed walking in hospital room?: A Little Help needed climbing 3-5 steps with a railing? : A Lot 6 Click Score: 19    End of Session Equipment Utilized During Treatment: Gait belt Activity Tolerance: Patient tolerated treatment well Patient left: in chair;with call bell/phone within reach;with family/visitor present;Other (comment) (with OT) Nurse Communication: Mobility status PT Visit Diagnosis: Other abnormalities of gait and mobility (R26.89);Pain Pain - Right/Left: Left Pain - part of body: Knee     Time: 4174-0814 PT Time Calculation (min) (ACUTE ONLY): 19 min  Charges:  $Therapeutic Exercise: 8-22 mins                    G Codes:      Mabeline Caras, PT, DPT Acute Rehab Services  Pager: [place] 07/22/2017, 3:15 PM

## 2017-07-22 NOTE — Progress Notes (Signed)
Subjective: 1 Day Post-Op Procedure(s) (LRB): TOTAL KNEE ARTHROPLASTY (Left)  Activity level:  wbat Diet tolerance:  ok Voiding:  ok Patient reports pain as mild and moderate.    Objective: Vital signs in last 24 hours: Temp:  [97.5 F (36.4 C)-98.6 F (37 C)] 98.6 F (37 C) (09/12 0347) Pulse Rate:  [73-80] 79 (09/12 0613) Resp:  [12-20] 18 (09/12 0613) BP: (118-137)/(67-93) 136/72 (09/12 0613) SpO2:  [92 %-97 %] 94 % (09/12 0613)  Labs: No results for input(s): HGB in the last 72 hours. No results for input(s): WBC, RBC, HCT, PLT in the last 72 hours. No results for input(s): NA, K, CL, CO2, BUN, CREATININE, GLUCOSE, CALCIUM in the last 72 hours. No results for input(s): LABPT, INR in the last 72 hours.  Physical Exam:  Neurologically intact ABD soft Neurovascular intact Sensation intact distally Intact pulses distally Dorsiflexion/Plantar flexion intact Incision: dressing C/D/I and no drainage No cellulitis present Compartment soft  Assessment/Plan:  1 Day Post-Op Procedure(s) (LRB): TOTAL KNEE ARTHROPLASTY (Left) Advance diet Up with therapy D/C IV fluids Plan for discharge tomorrow Discharge home with home health if doing well and cleared by PT. Continue on ASA 325mg  BID x 2 weeks post op. I will remove ace wrap tomorrow.  Follow up in office 2 weeks post op.  Vedder Brittian, [place] Sachs 07/22/2017, 7:41 AM

## 2017-07-23 LAB — GLUCOSE, CAPILLARY
GLUCOSE-CAPILLARY: 116 mg/dL — AB (ref 65–99)
GLUCOSE-CAPILLARY: 127 mg/dL — AB (ref 65–99)

## 2017-07-23 MED ORDER — ASPIRIN 325 MG PO TBEC: 325.0000 mg | DELAYED_RELEASE_TABLET | Freq: Two times a day (BID) | ORAL | 0 refills | Status: DC

## 2017-07-23 MED ORDER — METHOCARBAMOL 500 MG PO TABS: 500.0000 mg | ORAL_TABLET | Freq: Four times a day (QID) | ORAL | 0 refills | Status: DC | PRN

## 2017-07-23 MED ORDER — HYDROCODONE-ACETAMINOPHEN 5-325 MG PO TABS: 1.0000 | ORAL_TABLET | ORAL | 0 refills | Status: DC | PRN

## 2017-07-23 MED ORDER — BISACODYL 5 MG PO TBEC: 5.0000 mg | DELAYED_RELEASE_TABLET | Freq: Every day | ORAL | 0 refills | Status: DC | PRN

## 2017-07-23 MED ORDER — DOCUSATE SODIUM 100 MG PO CAPS: 100.0000 mg | ORAL_CAPSULE | Freq: Two times a day (BID) | ORAL | 0 refills | Status: DC

## 2017-07-23 NOTE — Discharge Summary (Signed)
Patient ID: Natalie Hensley MRN: [id number] DOB/AGE: 18-Sep-1956 61 y.o.  Admit date: 07/21/2017 Discharge date: 07/23/2017  Admission Diagnoses:  Principal Problem:   Primary localized osteoarthritis of left knee Active Problems:   Primary osteoarthritis of left knee   Discharge Diagnoses:  Same  Past Medical History:  Diagnosis Date  . Anxiety   . Arthritis   . Carpal tunnel syndrome   . Complication of anesthesia   . Coronary artery disease   . Depression   . Diabetes ([place])   . Family history of adverse reaction to anesthesia    SISTER HAS NAUSEA   . GERD (gastroesophageal reflux disease)   . Goiter   . HTN (hypertension)   . Hyperlipidemia   . Hypothyroidism   . Myocardial infarction ([place])   . PONV (postoperative nausea and vomiting)   . Thyroid cancer ([place])   . Urinary frequency     Surgeries: Procedure(s): TOTAL KNEE ARTHROPLASTY on 07/21/2017   Consultants:   Discharged Condition: Improved  Hospital Course: Natalie Hensley is an 61 y.o. female who was admitted 07/21/2017 for operative treatment ofPrimary localized osteoarthritis of left knee. Patient has severe unremitting pain that affects sleep, daily activities, and work/hobbies. After pre-op clearance the patient was taken to the operating room on 07/21/2017 and underwent  Procedure(s): TOTAL KNEE ARTHROPLASTY.    Patient was given perioperative antibiotics: Anti-infectives    Start     Dose/Rate Route Frequency Ordered Stop   07/21/17 1400  ceFAZolin (ANCEF) IVPB 2g/100 mL premix     2 g 200 mL/hr over 30 Minutes Intravenous Every 6 hours 07/21/17 1158 07/21/17 2157   07/21/17 0822  cefUROXime (ZINACEF) injection  Status:  Discontinued       As needed 07/21/17 0822 07/21/17 1015   07/21/17 0552  ceFAZolin (ANCEF) 2-4 GM/100ML-% IVPB    Comments:  Leandrew Koyanagi   : cabinet override      07/21/17 0552 07/21/17 0711   07/21/17 0551  ceFAZolin (ANCEF) IVPB 2g/100 mL premix     2 g 200 mL/hr over 30  Minutes Intravenous On call to O.R. 07/21/17 5852 07/21/17 0741       Patient was given sequential compression devices, early ambulation, and chemoprophylaxis to prevent DVT.  Patient benefited maximally from hospital stay and there were no complications.    Recent vital signs: Patient Vitals for the past 24 hrs:  BP Temp Temp src Pulse Resp SpO2  07/23/17 0516 138/68 98.8 F (37.1 C) Oral 81 18 98 %  07/22/17 2122 126/63 98.3 F (36.8 C) Oral 80 18 95 %  07/22/17 1300 125/71 98.5 F (36.9 C) Oral 79 18 95 %     Recent laboratory studies: No results for input(s): WBC, HGB, HCT, PLT, NA, K, CL, CO2, BUN, CREATININE, GLUCOSE, INR, CALCIUM in the last 72 hours.  Invalid input(s): PT, 2   Discharge Medications:   Allergies as of 07/23/2017      Reactions   Sulfa Antibiotics Rash      Medication List    STOP taking these medications   aspirin 81 MG tablet Replaced by:  aspirin 325 MG EC tablet     TAKE these medications   ALPRAZolam 0.25 MG tablet Commonly known as:  XANAX Take 0.125 mg by mouth daily as needed for anxiety.   aspirin 325 MG EC tablet Take 1 tablet (325 mg total) by mouth 2 (two) times daily after a meal. Replaces:  aspirin 81 MG tablet   bisacodyl  5 MG EC tablet Commonly known as:  DULCOLAX Take 1 tablet (5 mg total) by mouth daily as needed for moderate constipation.   busPIRone 10 MG tablet Commonly known as:  BUSPAR Take 10 mg by mouth 2 (two) times daily.   docusate sodium 100 MG capsule Commonly known as:  COLACE Take 1 capsule (100 mg total) by mouth 2 (two) times daily.   ezetimibe 10 MG tablet Commonly known as:  ZETIA Take 1 tablet (10 mg total) by mouth daily.   HYDROcodone-acetaminophen 5-325 MG tablet Commonly known as:  NORCO/VICODIN Take 1-2 tablets by mouth every 4 (four) hours as needed (breakthrough pain).   levothyroxine 112 MCG tablet Commonly known as:  SYNTHROID, LEVOTHROID Take 112 mcg by mouth daily before  breakfast.   metFORMIN 500 MG tablet Commonly known as:  GLUCOPHAGE Take 500 mg by mouth 4 (four) times a week. Monday, Wednesday Friday and Saturday   methocarbamol 500 MG tablet Commonly known as:  ROBAXIN Take 1 tablet (500 mg total) by mouth every 6 (six) hours as needed for muscle spasms.   metoprolol tartrate 50 MG tablet Commonly known as:  LOPRESSOR Take 12.5 mg by mouth 2 (two) times daily.   nitroGLYCERIN 0.4 MG SL tablet Commonly known as:  NITROSTAT PLACE 1 TABLET UNDER TONGUE EVERY 5 MINUTES AS NEEDED FOR CHEST PAIN   omeprazole 20 MG capsule Commonly known as:  PRILOSEC Take 20 mg by mouth daily.   simvastatin 40 MG tablet Commonly known as:  ZOCOR Take one tablet by mouth every day in the evening.            Durable Medical Equipment        Start     Ordered   07/21/17 1159  DME Walker rolling  Once    Question:  Patient needs a walker to treat with the following condition  Answer:  Primary osteoarthritis of left knee   07/21/17 1158   07/21/17 1159  DME 3 n 1  Once     07/21/17 1158   07/21/17 1159  DME Bedside commode  Once    Question:  Patient needs a bedside commode to treat with the following condition  Answer:  Primary osteoarthritis of left knee   07/21/17 1158       Discharge Care Instructions        Start     Ordered   07/23/17 0000  aspirin EC 325 MG EC tablet  2 times daily after meals    Question:  Supervising Provider  Answer:  Melrose Nakayama   07/23/17 0640   07/23/17 0000  bisacodyl (DULCOLAX) 5 MG EC tablet  Daily PRN    Question:  Supervising Provider  Answer:  Melrose Nakayama   07/23/17 0640   07/23/17 0000  docusate sodium (COLACE) 100 MG capsule  2 times daily    Question:  Supervising Provider  Answer:  Melrose Nakayama   07/23/17 0640   07/23/17 0000  HYDROcodone-acetaminophen (NORCO/VICODIN) 5-325 MG tablet  Every 4 hours PRN    Question:  Supervising Provider  Answer:  Melrose Nakayama   07/23/17 0640   07/23/17  0000  methocarbamol (ROBAXIN) 500 MG tablet  Every 6 hours PRN    Question:  Supervising Provider  Answer:  Melrose Nakayama   07/23/17 0640   07/23/17 0000  Call MD / Call 911    Comments:  If you experience chest pain or shortness of breath, CALL 911 and be transported to the hospital  emergency room.  If you develope a fever above 101 F, pus (white drainage) or increased drainage or redness at the wound, or calf pain, call your surgeon's office.   07/23/17 0640   07/23/17 0000  Diet - low sodium heart healthy     07/23/17 0640   07/23/17 0000  Constipation Prevention    Comments:  Drink plenty of fluids.  Prune juice may be helpful.  You may use a stool softener, such as Colace (over the counter) 100 mg twice a day.  Use MiraLax (over the counter) for constipation as needed.   07/23/17 0640   07/23/17 0000  Increase activity slowly as tolerated     07/23/17 0640   07/23/17 0000  Discharge instructions    Comments:  INSTRUCTIONS AFTER JOINT REPLACEMENT   Remove items at home which could result in a fall. This includes throw rugs or furniture in walking pathways ICE to the affected joint every three hours while awake for 30 minutes at a time, for at least the first 3-5 days, and then as needed for pain and swelling.  Continue to use ice for pain and swelling. You may notice swelling that will progress down to the foot and ankle.  This is normal after surgery.  Elevate your leg when you are not up walking on it.   Continue to use the breathing machine you got in the hospital (incentive spirometer) which will help keep your temperature down.  It is common for your temperature to cycle up and down following surgery, especially at night when you are not up moving around and exerting yourself.  The breathing machine keeps your lungs expanded and your temperature down.   DIET:  As you were doing prior to hospitalization, we recommend a well-balanced diet.  DRESSING / WOUND CARE / SHOWERING  You may  shower 3 days after surgery, but keep the wounds dry during showering.  You may use an occlusive plastic wrap (Press'n Seal for example), NO SOAKING/SUBMERGING IN THE BATHTUB.  If the bandage gets wet, change with a clean dry gauze.  If the incision gets wet, pat the wound dry with a clean towel.  ACTIVITY  Increase activity slowly as tolerated, but follow the weight bearing instructions below.   No driving for 6 weeks or until further direction given by your physician.  You cannot drive while taking narcotics.  No lifting or carrying greater than 10 lbs. until further directed by your surgeon. Avoid periods of inactivity such as sitting longer than an hour when not asleep. This helps prevent blood clots.  You may return to work once you are authorized by your doctor.     WEIGHT BEARING   Weight bearing as tolerated with assist device (walker, cane, etc) as directed, use it as long as suggested by your surgeon or therapist, typically at least 4-6 weeks.   EXERCISES  Results after joint replacement surgery are often greatly improved when you follow the exercise, range of motion and muscle strengthening exercises prescribed by your doctor. Safety measures are also important to protect the joint from further injury. Any time any of these exercises cause you to have increased pain or swelling, decrease what you are doing until you are comfortable again and then slowly increase them. If you have problems or questions, call your caregiver or physical therapist for advice.   Rehabilitation is important following a joint replacement. After just a few days of immobilization, the muscles of the leg can become weakened and  shrink (atrophy).  These exercises are designed to build up the tone and strength of the thigh and leg muscles and to improve motion. Often times heat used for twenty to thirty minutes before working out will loosen up your tissues and help with improving the range of motion but do not  use heat for the first two weeks following surgery (sometimes heat can increase post-operative swelling).   These exercises can be done on a training (exercise) mat, on the floor, on a table or on a bed. Use whatever works the best and is most comfortable for you.    Use music or television while you are exercising so that the exercises are a pleasant break in your day. This will make your life better with the exercises acting as a break in your routine that you can look forward to.   Perform all exercises about fifteen times, three times per day or as directed.  You should exercise both the operative leg and the other leg as well.   Exercises include:   Quad Sets - Tighten up the muscle on the front of the thigh (Quad) and hold for 5-10 seconds.   Straight Leg Raises - With your knee straight (if you were given a brace, keep it on), lift the leg to 60 degrees, hold for 3 seconds, and slowly lower the leg.  Perform this exercise against resistance later as your leg gets stronger.  Leg Slides: Lying on your back, slowly slide your foot toward your buttocks, bending your knee up off the floor (only go as far as is comfortable). Then slowly slide your foot back down until your leg is flat on the floor again.  Angel Wings: Lying on your back spread your legs to the side as far apart as you can without causing discomfort.  Hamstring Strength:  Lying on your back, push your heel against the floor with your leg straight by tightening up the muscles of your buttocks.  Repeat, but this time bend your knee to a comfortable angle, and push your heel against the floor.  You may put a pillow under the heel to make it more comfortable if necessary.   A rehabilitation program following joint replacement surgery can speed recovery and prevent re-injury in the future due to weakened muscles. Contact your doctor or a physical therapist for more information on knee rehabilitation.    CONSTIPATION  Constipation is  defined medically as fewer than three stools per week and severe constipation as less than one stool per week.  Even if you have a regular bowel pattern at home, your normal regimen is likely to be disrupted due to multiple reasons following surgery.  Combination of anesthesia, postoperative narcotics, change in appetite and fluid intake all can affect your bowels.   YOU MUST use at least one of the following options; they are listed in order of increasing strength to get the job done.  They are all available over the counter, and you may need to use some, POSSIBLY even all of these options:    Drink plenty of fluids (prune juice may be helpful) and high fiber foods Colace 100 mg by mouth twice a day  Senokot for constipation as directed and as needed Dulcolax (bisacodyl), take with full glass of water  Miralax (polyethylene glycol) once or twice a day as needed.  If you have tried all these things and are unable to have a bowel movement in the first 3-4 days after surgery call either your  surgeon or your primary doctor.    If you experience loose stools or diarrhea, hold the medications until you stool forms back up.  If your symptoms do not get better within 1 week or if they get worse, check with your doctor.  If you experience "the worst abdominal pain ever" or develop nausea or vomiting, please contact the office immediately for further recommendations for treatment.   ITCHING:  If you experience itching with your medications, try taking only a single pain pill, or even half a pain pill at a time.  You can also use Benadryl over the counter for itching or also to help with sleep.   TED HOSE STOCKINGS:  Use stockings on both legs until for at least 2 weeks or as directed by physician office. They may be removed at night for sleeping.  MEDICATIONS:  See your medication summary on the "After Visit Summary" that nursing will review with you.  You may have some home medications which will be placed  on hold until you complete the course of blood thinner medication.  It is important for you to complete the blood thinner medication as prescribed.  PRECAUTIONS:  If you experience chest pain or shortness of breath - call 911 immediately for transfer to the hospital emergency department.   If you develop a fever greater that 101 F, purulent drainage from wound, increased redness or drainage from wound, foul odor from the wound/dressing, or calf pain - CONTACT YOUR SURGEON.                                                   FOLLOW-UP APPOINTMENTS:  If you do not already have a post-op appointment, please call the office for an appointment to be seen by your surgeon.  Guidelines for how soon to be seen are listed in your "After Visit Summary", but are typically between 1-4 weeks after surgery.  OTHER INSTRUCTIONS:   Knee Replacement:  Do not place pillow under knee, focus on keeping the knee straight while resting. CPM instructions: 0-90 degrees, 2 hours in the morning, 2 hours in the afternoon, and 2 hours in the evening. Place foam block, curve side up under heel at all times except when in CPM or when walking.  DO NOT modify, tear, cut, or change the foam block in any way.  MAKE SURE YOU:  Understand these instructions.  Get help right away if you are not doing well or get worse.    Thank you for letting us be a part of your medical care team.  It is a privilege we respect greatly.  We hope these instructions will help you stay on track for a fast and full recovery!   07/23/17 0640      Diagnostic Studies: Dg Chest 2 View  Result Date: 07/14/2017 CLINICAL DATA:  Preop for knee replacement. EXAM: CHEST  2 VIEW COMPARISON:  Radiographs of January 25, 2011. FINDINGS: The heart size and mediastinal contours are within normal limits. Both lungs are clear. Sternotomy wires are noted. No pneumothorax or pleural effusion is noted. The visualized skeletal structures are unremarkable. IMPRESSION: No  active cardiopulmonary disease. Electronically Signed   By: Marijo Conception, M.D.   On: 07/14/2017 09:22    Disposition: 01-Home or Self Care  Discharge Instructions    Call MD / Call 911  Complete by:  As directed    If you experience chest pain or shortness of breath, CALL 911 and be transported to the hospital emergency room.  If you develope a fever above 101 F, pus (white drainage) or increased drainage or redness at the wound, or calf pain, call your surgeon's office.   Constipation Prevention    Complete by:  As directed    Drink plenty of fluids.  Prune juice may be helpful.  You may use a stool softener, such as Colace (over the counter) 100 mg twice a day.  Use MiraLax (over the counter) for constipation as needed.   Diet - low sodium heart healthy    Complete by:  As directed    Discharge instructions    Complete by:  As directed    INSTRUCTIONS AFTER JOINT REPLACEMENT   Remove items at home which could result in a fall. This includes throw rugs or furniture in walking pathways ICE to the affected joint every three hours while awake for 30 minutes at a time, for at least the first 3-5 days, and then as needed for pain and swelling.  Continue to use ice for pain and swelling. You may notice swelling that will progress down to the foot and ankle.  This is normal after surgery.  Elevate your leg when you are not up walking on it.   Continue to use the breathing machine you got in the hospital (incentive spirometer) which will help keep your temperature down.  It is common for your temperature to cycle up and down following surgery, especially at night when you are not up moving around and exerting yourself.  The breathing machine keeps your lungs expanded and your temperature down.   DIET:  As you were doing prior to hospitalization, we recommend a well-balanced diet.  DRESSING / WOUND CARE / SHOWERING  You may shower 3 days after surgery, but keep the wounds dry during  showering.  You may use an occlusive plastic wrap (Press'n Seal for example), NO SOAKING/SUBMERGING IN THE BATHTUB.  If the bandage gets wet, change with a clean dry gauze.  If the incision gets wet, pat the wound dry with a clean towel.  ACTIVITY  Increase activity slowly as tolerated, but follow the weight bearing instructions below.   No driving for 6 weeks or until further direction given by your physician.  You cannot drive while taking narcotics.  No lifting or carrying greater than 10 lbs. until further directed by your surgeon. Avoid periods of inactivity such as sitting longer than an hour when not asleep. This helps prevent blood clots.  You may return to work once you are authorized by your doctor.     WEIGHT BEARING   Weight bearing as tolerated with assist device (walker, cane, etc) as directed, use it as long as suggested by your surgeon or therapist, typically at least 4-6 weeks.   EXERCISES  Results after joint replacement surgery are often greatly improved when you follow the exercise, range of motion and muscle strengthening exercises prescribed by your doctor. Safety measures are also important to protect the joint from further injury. Any time any of these exercises cause you to have increased pain or swelling, decrease what you are doing until you are comfortable again and then slowly increase them. If you have problems or questions, call your caregiver or physical therapist for advice.   Rehabilitation is important following a joint replacement. After just a few days of immobilization, the muscles  of the leg can become weakened and shrink (atrophy).  These exercises are designed to build up the tone and strength of the thigh and leg muscles and to improve motion. Often times heat used for twenty to thirty minutes before working out will loosen up your tissues and help with improving the range of motion but do not use heat for the first two weeks following surgery (sometimes  heat can increase post-operative swelling).   These exercises can be done on a training (exercise) mat, on the floor, on a table or on a bed. Use whatever works the best and is most comfortable for you.    Use music or television while you are exercising so that the exercises are a pleasant break in your day. This will make your life better with the exercises acting as a break in your routine that you can look forward to.   Perform all exercises about fifteen times, three times per day or as directed.  You should exercise both the operative leg and the other leg as well.   Exercises include:   Quad Sets - Tighten up the muscle on the front of the thigh (Quad) and hold for 5-10 seconds.   Straight Leg Raises - With your knee straight (if you were given a brace, keep it on), lift the leg to 60 degrees, hold for 3 seconds, and slowly lower the leg.  Perform this exercise against resistance later as your leg gets stronger.  Leg Slides: Lying on your back, slowly slide your foot toward your buttocks, bending your knee up off the floor (only go as far as is comfortable). Then slowly slide your foot back down until your leg is flat on the floor again.  Angel Wings: Lying on your back spread your legs to the side as far apart as you can without causing discomfort.  Hamstring Strength:  Lying on your back, push your heel against the floor with your leg straight by tightening up the muscles of your buttocks.  Repeat, but this time bend your knee to a comfortable angle, and push your heel against the floor.  You may put a pillow under the heel to make it more comfortable if necessary.   A rehabilitation program following joint replacement surgery can speed recovery and prevent re-injury in the future due to weakened muscles. Contact your doctor or a physical therapist for more information on knee rehabilitation.    CONSTIPATION  Constipation is defined medically as fewer than three stools per week and severe  constipation as less than one stool per week.  Even if you have a regular bowel pattern at home, your normal regimen is likely to be disrupted due to multiple reasons following surgery.  Combination of anesthesia, postoperative narcotics, change in appetite and fluid intake all can affect your bowels.   YOU MUST use at least one of the following options; they are listed in order of increasing strength to get the job done.  They are all available over the counter, and you may need to use some, POSSIBLY even all of these options:    Drink plenty of fluids (prune juice may be helpful) and high fiber foods Colace 100 mg by mouth twice a day  Senokot for constipation as directed and as needed Dulcolax (bisacodyl), take with full glass of water  Miralax (polyethylene glycol) once or twice a day as needed.  If you have tried all these things and are unable to have a bowel movement in the first  3-4 days after surgery call either your surgeon or your primary doctor.    If you experience loose stools or diarrhea, hold the medications until you stool forms back up.  If your symptoms do not get better within 1 week or if they get worse, check with your doctor.  If you experience "the worst abdominal pain ever" or develop nausea or vomiting, please contact the office immediately for further recommendations for treatment.   ITCHING:  If you experience itching with your medications, try taking only a single pain pill, or even half a pain pill at a time.  You can also use Benadryl over the counter for itching or also to help with sleep.   TED HOSE STOCKINGS:  Use stockings on both legs until for at least 2 weeks or as directed by physician office. They may be removed at night for sleeping.  MEDICATIONS:  See your medication summary on the "After Visit Summary" that nursing will review with you.  You may have some home medications which will be placed on hold until you complete the course of blood thinner  medication.  It is important for you to complete the blood thinner medication as prescribed.  PRECAUTIONS:  If you experience chest pain or shortness of breath - call 911 immediately for transfer to the hospital emergency department.   If you develop a fever greater that 101 F, purulent drainage from wound, increased redness or drainage from wound, foul odor from the wound/dressing, or calf pain - CONTACT YOUR SURGEON.                                                   FOLLOW-UP APPOINTMENTS:  If you do not already have a post-op appointment, please call the office for an appointment to be seen by your surgeon.  Guidelines for how soon to be seen are listed in your "After Visit Summary", but are typically between 1-4 weeks after surgery.  OTHER INSTRUCTIONS:   Knee Replacement:  Do not place pillow under knee, focus on keeping the knee straight while resting. CPM instructions: 0-90 degrees, 2 hours in the morning, 2 hours in the afternoon, and 2 hours in the evening. Place foam block, curve side up under heel at all times except when in CPM or when walking.  DO NOT modify, tear, cut, or change the foam block in any way.  MAKE SURE YOU:  Understand these instructions.  Get help right away if you are not doing well or get worse.    Thank you for letting us be a part of your medical care team.  It is a privilege we respect greatly.  We hope these instructions will help you stay on track for a fast and full recovery!   Increase activity slowly as tolerated    Complete by:  As directed       Follow-up Information    Melrose Nakayama, MD. Schedule an appointment as soon as possible for a visit in 2 weeks.   Specialty:  Orthopedic Surgery Contact information: [place] [address] [phone]            Signed: Rich Fuchs 07/23/2017, 6:40 AM

## 2017-07-23 NOTE — Care Management Note (Signed)
Case Management Note  Patient Details  Name: Natalie Hensley MRN: [id number] Date of Birth: June 06, 1956  Subjective/Objective:                    Action/Plan: [facility] She states she has transportation home. Sister is providing DME. Pt met with [employer] for CPM yesterday and equipment will be delivered to [facility]. Pt [facility] with [facility] for [facility] services.   Expected Discharge Date:  07/23/17               Expected Discharge Plan:  [place]  In-House Referral:     Discharge planning Services  CM Consult  Post Acute Care Choice:  Home Health Choice offered to:  Patient  DME Arranged:  Continuous passive motion machine DME Agency:  [employer]  HH Arranged:  PT HH Agency:  [place]  Status of Service:  Completed, signed off  If discussed at [place] of Stay Meetings, dates discussed:    Additional Comments:  Pollie Friar, RN 07/23/2017, 10:32 AM

## 2017-07-23 NOTE — Progress Notes (Signed)
Physical Therapy Treatment Patient Details Name: Natalie Hensley MRN: [id number] DOB: Oct 30, 1956 Today's Date: 07/23/2017    History of Present Illness Pt is a 61 y/o female s/p elective L TKA on 07/21/17. PMH includes MI, HTN, depression, DM, CAD, and carpal tunnel.     PT Comments    Pt presented in recliner chair with good pain control pre tx.  Pt re-educated on knee precautions and emphasized the importance of resting in supported extension.  Pt reviewed complete HEP in supine for accuracy and frequency when returning home.  PTA educated patient on stair training.  Pt performed multiple trials with success. Informed nursing that patient is ready to d/c home from a mobility standpoint.     Follow Up Recommendations  DC plan and follow up therapy as arranged by surgeon;Supervision for mobility/OOB     Equipment Recommendations  None recommended by PT (owns all DME)    Recommendations for Other Services       Precautions / Restrictions Precautions Precautions: Knee Precaution Comments: reviewed precautions Restrictions Weight Bearing Restrictions: Yes LLE Weight Bearing: Weight bearing as tolerated    Mobility  Bed Mobility               General bed mobility comments: Pt sitting in recliner on arrival.    Transfers Overall transfer level: Needs assistance Equipment used: Rolling walker (2 wheeled) Transfers: Sit to/from Stand Sit to Stand: Supervision         General transfer comment: Pt performed without assistance and good technique to ascend.  Pt presents with poor eccentric loading when returning to seated surface and required cues for hand placement to improve control.  Ambulation/Gait Ambulation/Gait assistance: Supervision Ambulation Distance (Feet): 120 Feet Assistive device: Rolling walker (2 wheeled) Gait Pattern/deviations: Step-through pattern;Decreased stride length;Shuffle;Trunk flexed Gait velocity: Decreased Gait velocity interpretation: Below  normal speed for age/gender General Gait Details: Pt with mild complaints of dizziness.  Pt required cues for RW safety, upper trunk control and gait symmetry.     Stairs Stairs: Yes   Stair Management: No rails;Step to pattern;Backwards;Forwards Number of Stairs: 5 General stair comments: Pt performed curb training x 5 reps.  x3 forward to ascend and x2 backwards to ascend.  Cues for sequencing and RW placement.  Pt descending all 5 trials forward.    Wheelchair Mobility    Modified Rankin (Stroke Patients Only)       Balance Overall balance assessment: Needs assistance   Sitting balance-Leahy Scale: Good       Standing balance-Leahy Scale: Fair                              Cognition Arousal/Alertness: Awake/alert Behavior During Therapy: WFL for tasks assessed/performed Overall Cognitive Status: Within Functional Limits for tasks assessed                                        Exercises Total Joint Exercises Ankle Circles/Pumps: AROM;Both;10 reps;Supine Quad Sets: AROM;Left;10 reps;Supine Towel Squeeze: AROM;Both;10 reps;Supine Short Arc Quad: AROM;Left;10 reps;Supine Heel Slides: AROM;Left;10 reps;Supine Hip ABduction/ADduction: AROM;Left;10 reps;Supine Straight Leg Raises: AROM;Left;10 reps;Supine Goniometric ROM: 82 degrees flexion in L knee.      General Comments        Pertinent Vitals/Pain Pain Assessment: 0-10 Pain Score: 5  Pain Location: L knee  Pain Descriptors / Indicators: Sore;Aching;Discomfort;Operative site guarding  Pain Intervention(s): Monitored during session;Repositioned;Ice applied    Home Living                      Prior Function            PT Goals (current goals can now be found in the care plan section) Acute Rehab PT Goals Patient Stated Goal: to go home  Potential to Achieve Goals: Good Progress towards PT goals: Progressing toward goals    Frequency    7X/week      PT Plan  Current plan remains appropriate    Co-evaluation              AM-PAC PT "6 Clicks" Daily Activity  Outcome Measure  Difficulty turning over in bed (including adjusting bedclothes, sheets and blankets)?: None Difficulty moving from lying on back to sitting on the side of the bed? : None Difficulty sitting down on and standing up from a chair with arms (e.g., wheelchair, bedside commode, etc,.)?: A Little Help needed moving to and from a bed to chair (including a wheelchair)?: A Little Help needed walking in hospital room?: A Little Help needed climbing 3-5 steps with a railing? : A Little 6 Click Score: 20    End of Session Equipment Utilized During Treatment: Gait belt Activity Tolerance: Patient tolerated treatment well Patient left: in chair;with call bell/phone within reach   PT Visit Diagnosis: Other abnormalities of gait and mobility (R26.89);Pain Pain - Right/Left: Left Pain - part of body: Knee     Time: 3220-2542 PT Time Calculation (min) (ACUTE ONLY): 27 min  Charges:  $Gait Training: 8-22 mins $Therapeutic Exercise: 8-22 mins                    G Codes:       Governor Rooks, PTA pager [phone]    Cristela Blue 07/23/2017, 11:43 AM

## 2017-07-24 ENCOUNTER — Other Ambulatory Visit: Payer: Self-pay | Admitting: Cardiovascular Disease

## 2017-07-24 DIAGNOSIS — I2510 Atherosclerotic heart disease of native coronary artery without angina pectoris: Secondary | ICD-10-CM | POA: Diagnosis not present

## 2017-07-24 DIAGNOSIS — Z7901 Long term (current) use of anticoagulants: Secondary | ICD-10-CM | POA: Diagnosis not present

## 2017-07-24 DIAGNOSIS — Z471 Aftercare following joint replacement surgery: Secondary | ICD-10-CM | POA: Diagnosis not present

## 2017-07-24 DIAGNOSIS — Z7984 Long term (current) use of oral hypoglycemic drugs: Secondary | ICD-10-CM | POA: Diagnosis not present

## 2017-07-24 DIAGNOSIS — E119 Type 2 diabetes mellitus without complications: Secondary | ICD-10-CM | POA: Diagnosis not present

## 2017-07-24 DIAGNOSIS — I10 Essential (primary) hypertension: Secondary | ICD-10-CM | POA: Diagnosis not present

## 2017-07-24 DIAGNOSIS — Z96652 Presence of left artificial knee joint: Secondary | ICD-10-CM | POA: Diagnosis not present

## 2017-07-24 NOTE — Telephone Encounter (Signed)
ezetimibe (ZETIA) 10 MG tablet  Medication  Date: 06/25/2017 Department: [address] Office Ordering/Authorizing: Josue Hector, MD  Order Providers   Prescribing Provider Encounter Provider  Josue Hector, MD Josue Hector, MD  Medication Detail    Disp Refills Start End   ezetimibe (ZETIA) 10 MG tablet 90 tablet 3 06/25/2017    Sig - Route: Take 1 tablet (10 mg total) by mouth daily. - Oral   Sent to pharmacy as: ezetimibe (ZETIA) 10 MG tablet   E-Prescribing Status: Receipt confirmed by pharmacy (06/25/2017 4:22 PM EDT)   Pharmacy   [id number] - [place], [place] - [address] already on file for a year. Refills not appropriate at this time.

## 2017-07-27 DIAGNOSIS — M1712 Unilateral primary osteoarthritis, left knee: Secondary | ICD-10-CM | POA: Diagnosis not present

## 2017-07-27 DIAGNOSIS — Z96659 Presence of unspecified artificial knee joint: Secondary | ICD-10-CM | POA: Diagnosis not present

## 2017-07-27 DIAGNOSIS — I2510 Atherosclerotic heart disease of native coronary artery without angina pectoris: Secondary | ICD-10-CM | POA: Diagnosis not present

## 2017-07-27 DIAGNOSIS — I10 Essential (primary) hypertension: Secondary | ICD-10-CM | POA: Diagnosis not present

## 2017-07-27 DIAGNOSIS — Z96652 Presence of left artificial knee joint: Secondary | ICD-10-CM | POA: Diagnosis not present

## 2017-07-27 DIAGNOSIS — Z7984 Long term (current) use of oral hypoglycemic drugs: Secondary | ICD-10-CM | POA: Diagnosis not present

## 2017-07-27 DIAGNOSIS — Z471 Aftercare following joint replacement surgery: Secondary | ICD-10-CM | POA: Diagnosis not present

## 2017-07-27 DIAGNOSIS — E119 Type 2 diabetes mellitus without complications: Secondary | ICD-10-CM | POA: Diagnosis not present

## 2017-07-27 DIAGNOSIS — Z7901 Long term (current) use of anticoagulants: Secondary | ICD-10-CM | POA: Diagnosis not present

## 2017-07-27 DIAGNOSIS — I82442 Acute embolism and thrombosis of left tibial vein: Secondary | ICD-10-CM | POA: Diagnosis not present

## 2017-07-29 DIAGNOSIS — I2510 Atherosclerotic heart disease of native coronary artery without angina pectoris: Secondary | ICD-10-CM | POA: Diagnosis not present

## 2017-07-29 DIAGNOSIS — Z7984 Long term (current) use of oral hypoglycemic drugs: Secondary | ICD-10-CM | POA: Diagnosis not present

## 2017-07-29 DIAGNOSIS — I10 Essential (primary) hypertension: Secondary | ICD-10-CM | POA: Diagnosis not present

## 2017-07-29 DIAGNOSIS — Z7901 Long term (current) use of anticoagulants: Secondary | ICD-10-CM | POA: Diagnosis not present

## 2017-07-29 DIAGNOSIS — E119 Type 2 diabetes mellitus without complications: Secondary | ICD-10-CM | POA: Diagnosis not present

## 2017-07-29 DIAGNOSIS — Z96652 Presence of left artificial knee joint: Secondary | ICD-10-CM | POA: Diagnosis not present

## 2017-07-29 DIAGNOSIS — Z471 Aftercare following joint replacement surgery: Secondary | ICD-10-CM | POA: Diagnosis not present

## 2017-07-31 DIAGNOSIS — Z7901 Long term (current) use of anticoagulants: Secondary | ICD-10-CM | POA: Diagnosis not present

## 2017-07-31 DIAGNOSIS — Z96652 Presence of left artificial knee joint: Secondary | ICD-10-CM | POA: Diagnosis not present

## 2017-07-31 DIAGNOSIS — I10 Essential (primary) hypertension: Secondary | ICD-10-CM | POA: Diagnosis not present

## 2017-07-31 DIAGNOSIS — I2510 Atherosclerotic heart disease of native coronary artery without angina pectoris: Secondary | ICD-10-CM | POA: Diagnosis not present

## 2017-07-31 DIAGNOSIS — Z471 Aftercare following joint replacement surgery: Secondary | ICD-10-CM | POA: Diagnosis not present

## 2017-07-31 DIAGNOSIS — Z7984 Long term (current) use of oral hypoglycemic drugs: Secondary | ICD-10-CM | POA: Diagnosis not present

## 2017-07-31 DIAGNOSIS — E119 Type 2 diabetes mellitus without complications: Secondary | ICD-10-CM | POA: Diagnosis not present

## 2017-08-03 DIAGNOSIS — Z471 Aftercare following joint replacement surgery: Secondary | ICD-10-CM | POA: Diagnosis not present

## 2017-08-03 DIAGNOSIS — Z96652 Presence of left artificial knee joint: Secondary | ICD-10-CM | POA: Diagnosis not present

## 2017-08-03 DIAGNOSIS — M1711 Unilateral primary osteoarthritis, right knee: Secondary | ICD-10-CM | POA: Diagnosis not present

## 2017-08-04 DIAGNOSIS — Z471 Aftercare following joint replacement surgery: Secondary | ICD-10-CM | POA: Diagnosis not present

## 2017-08-04 DIAGNOSIS — I2510 Atherosclerotic heart disease of native coronary artery without angina pectoris: Secondary | ICD-10-CM | POA: Diagnosis not present

## 2017-08-04 DIAGNOSIS — I10 Essential (primary) hypertension: Secondary | ICD-10-CM | POA: Diagnosis not present

## 2017-08-04 DIAGNOSIS — Z96652 Presence of left artificial knee joint: Secondary | ICD-10-CM | POA: Diagnosis not present

## 2017-08-04 DIAGNOSIS — Z7984 Long term (current) use of oral hypoglycemic drugs: Secondary | ICD-10-CM | POA: Diagnosis not present

## 2017-08-04 DIAGNOSIS — E119 Type 2 diabetes mellitus without complications: Secondary | ICD-10-CM | POA: Diagnosis not present

## 2017-08-04 DIAGNOSIS — Z7901 Long term (current) use of anticoagulants: Secondary | ICD-10-CM | POA: Diagnosis not present

## 2017-08-06 DIAGNOSIS — Z7984 Long term (current) use of oral hypoglycemic drugs: Secondary | ICD-10-CM | POA: Diagnosis not present

## 2017-08-06 DIAGNOSIS — I10 Essential (primary) hypertension: Secondary | ICD-10-CM | POA: Diagnosis not present

## 2017-08-06 DIAGNOSIS — Z96652 Presence of left artificial knee joint: Secondary | ICD-10-CM | POA: Diagnosis not present

## 2017-08-06 DIAGNOSIS — H2513 Age-related nuclear cataract, bilateral: Secondary | ICD-10-CM | POA: Diagnosis not present

## 2017-08-06 DIAGNOSIS — Z7901 Long term (current) use of anticoagulants: Secondary | ICD-10-CM | POA: Diagnosis not present

## 2017-08-06 DIAGNOSIS — Z471 Aftercare following joint replacement surgery: Secondary | ICD-10-CM | POA: Diagnosis not present

## 2017-08-06 DIAGNOSIS — E119 Type 2 diabetes mellitus without complications: Secondary | ICD-10-CM | POA: Diagnosis not present

## 2017-08-06 DIAGNOSIS — I2510 Atherosclerotic heart disease of native coronary artery without angina pectoris: Secondary | ICD-10-CM | POA: Diagnosis not present

## 2017-08-07 DIAGNOSIS — I10 Essential (primary) hypertension: Secondary | ICD-10-CM | POA: Diagnosis not present

## 2017-08-07 DIAGNOSIS — Z23 Encounter for immunization: Secondary | ICD-10-CM | POA: Diagnosis not present

## 2017-08-07 DIAGNOSIS — Z471 Aftercare following joint replacement surgery: Secondary | ICD-10-CM | POA: Diagnosis not present

## 2017-08-07 DIAGNOSIS — E119 Type 2 diabetes mellitus without complications: Secondary | ICD-10-CM | POA: Diagnosis not present

## 2017-08-07 DIAGNOSIS — Z7984 Long term (current) use of oral hypoglycemic drugs: Secondary | ICD-10-CM | POA: Diagnosis not present

## 2017-08-07 DIAGNOSIS — I2510 Atherosclerotic heart disease of native coronary artery without angina pectoris: Secondary | ICD-10-CM | POA: Diagnosis not present

## 2017-08-07 DIAGNOSIS — Z96652 Presence of left artificial knee joint: Secondary | ICD-10-CM | POA: Diagnosis not present

## 2017-08-07 DIAGNOSIS — Z7901 Long term (current) use of anticoagulants: Secondary | ICD-10-CM | POA: Diagnosis not present

## 2017-08-11 DIAGNOSIS — Z96652 Presence of left artificial knee joint: Secondary | ICD-10-CM | POA: Diagnosis not present

## 2017-08-11 DIAGNOSIS — M25662 Stiffness of left knee, not elsewhere classified: Secondary | ICD-10-CM | POA: Diagnosis not present

## 2017-08-11 DIAGNOSIS — M25562 Pain in left knee: Secondary | ICD-10-CM | POA: Diagnosis not present

## 2017-08-12 DIAGNOSIS — M25662 Stiffness of left knee, not elsewhere classified: Secondary | ICD-10-CM | POA: Diagnosis not present

## 2017-08-12 DIAGNOSIS — Z96652 Presence of left artificial knee joint: Secondary | ICD-10-CM | POA: Diagnosis not present

## 2017-08-12 DIAGNOSIS — M25562 Pain in left knee: Secondary | ICD-10-CM | POA: Diagnosis not present

## 2017-08-17 DIAGNOSIS — M25562 Pain in left knee: Secondary | ICD-10-CM | POA: Diagnosis not present

## 2017-08-17 DIAGNOSIS — Z471 Aftercare following joint replacement surgery: Secondary | ICD-10-CM | POA: Diagnosis not present

## 2017-08-17 DIAGNOSIS — Z96652 Presence of left artificial knee joint: Secondary | ICD-10-CM | POA: Diagnosis not present

## 2017-08-18 DIAGNOSIS — F324 Major depressive disorder, single episode, in partial remission: Secondary | ICD-10-CM | POA: Diagnosis not present

## 2017-08-18 DIAGNOSIS — M25562 Pain in left knee: Secondary | ICD-10-CM | POA: Diagnosis not present

## 2017-08-18 DIAGNOSIS — Z96652 Presence of left artificial knee joint: Secondary | ICD-10-CM | POA: Diagnosis not present

## 2017-08-18 DIAGNOSIS — M25662 Stiffness of left knee, not elsewhere classified: Secondary | ICD-10-CM | POA: Diagnosis not present

## 2017-08-20 DIAGNOSIS — M25662 Stiffness of left knee, not elsewhere classified: Secondary | ICD-10-CM | POA: Diagnosis not present

## 2017-08-20 DIAGNOSIS — M25562 Pain in left knee: Secondary | ICD-10-CM | POA: Diagnosis not present

## 2017-08-20 DIAGNOSIS — Z96652 Presence of left artificial knee joint: Secondary | ICD-10-CM | POA: Diagnosis not present

## 2017-08-25 DIAGNOSIS — M25562 Pain in left knee: Secondary | ICD-10-CM | POA: Diagnosis not present

## 2017-08-25 DIAGNOSIS — M25662 Stiffness of left knee, not elsewhere classified: Secondary | ICD-10-CM | POA: Diagnosis not present

## 2017-08-25 DIAGNOSIS — Z96652 Presence of left artificial knee joint: Secondary | ICD-10-CM | POA: Diagnosis not present

## 2017-08-27 DIAGNOSIS — M25562 Pain in left knee: Secondary | ICD-10-CM | POA: Diagnosis not present

## 2017-08-27 DIAGNOSIS — Z96652 Presence of left artificial knee joint: Secondary | ICD-10-CM | POA: Diagnosis not present

## 2017-08-27 DIAGNOSIS — M25662 Stiffness of left knee, not elsewhere classified: Secondary | ICD-10-CM | POA: Diagnosis not present

## 2017-09-01 DIAGNOSIS — M25562 Pain in left knee: Secondary | ICD-10-CM | POA: Diagnosis not present

## 2017-09-01 DIAGNOSIS — F324 Major depressive disorder, single episode, in partial remission: Secondary | ICD-10-CM | POA: Diagnosis not present

## 2017-09-03 DIAGNOSIS — Z96652 Presence of left artificial knee joint: Secondary | ICD-10-CM | POA: Diagnosis not present

## 2017-09-03 DIAGNOSIS — M25662 Stiffness of left knee, not elsewhere classified: Secondary | ICD-10-CM | POA: Diagnosis not present

## 2017-09-03 DIAGNOSIS — M25562 Pain in left knee: Secondary | ICD-10-CM | POA: Diagnosis not present

## 2017-09-08 DIAGNOSIS — Z96652 Presence of left artificial knee joint: Secondary | ICD-10-CM | POA: Diagnosis not present

## 2017-09-08 DIAGNOSIS — M25562 Pain in left knee: Secondary | ICD-10-CM | POA: Diagnosis not present

## 2017-09-08 DIAGNOSIS — M25662 Stiffness of left knee, not elsewhere classified: Secondary | ICD-10-CM | POA: Diagnosis not present

## 2017-09-10 DIAGNOSIS — M25562 Pain in left knee: Secondary | ICD-10-CM | POA: Diagnosis not present

## 2017-09-10 DIAGNOSIS — M25662 Stiffness of left knee, not elsewhere classified: Secondary | ICD-10-CM | POA: Diagnosis not present

## 2017-09-10 DIAGNOSIS — Z96652 Presence of left artificial knee joint: Secondary | ICD-10-CM | POA: Diagnosis not present

## 2017-09-14 DIAGNOSIS — Z9889 Other specified postprocedural states: Secondary | ICD-10-CM | POA: Diagnosis not present

## 2017-09-15 DIAGNOSIS — Z96652 Presence of left artificial knee joint: Secondary | ICD-10-CM | POA: Diagnosis not present

## 2017-09-15 DIAGNOSIS — M25662 Stiffness of left knee, not elsewhere classified: Secondary | ICD-10-CM | POA: Diagnosis not present

## 2017-09-15 DIAGNOSIS — M25562 Pain in left knee: Secondary | ICD-10-CM | POA: Diagnosis not present

## 2017-09-17 DIAGNOSIS — Z96652 Presence of left artificial knee joint: Secondary | ICD-10-CM | POA: Diagnosis not present

## 2017-09-17 DIAGNOSIS — M25562 Pain in left knee: Secondary | ICD-10-CM | POA: Diagnosis not present

## 2017-09-17 DIAGNOSIS — M25662 Stiffness of left knee, not elsewhere classified: Secondary | ICD-10-CM | POA: Diagnosis not present

## 2017-09-22 DIAGNOSIS — M25562 Pain in left knee: Secondary | ICD-10-CM | POA: Diagnosis not present

## 2017-09-22 DIAGNOSIS — M25662 Stiffness of left knee, not elsewhere classified: Secondary | ICD-10-CM | POA: Diagnosis not present

## 2017-09-22 DIAGNOSIS — Z96652 Presence of left artificial knee joint: Secondary | ICD-10-CM | POA: Diagnosis not present

## 2017-09-24 DIAGNOSIS — M25562 Pain in left knee: Secondary | ICD-10-CM | POA: Diagnosis not present

## 2017-09-24 DIAGNOSIS — Z96652 Presence of left artificial knee joint: Secondary | ICD-10-CM | POA: Diagnosis not present

## 2017-09-24 DIAGNOSIS — M25662 Stiffness of left knee, not elsewhere classified: Secondary | ICD-10-CM | POA: Diagnosis not present

## 2017-09-29 DIAGNOSIS — M25562 Pain in left knee: Secondary | ICD-10-CM | POA: Diagnosis not present

## 2017-09-29 DIAGNOSIS — M25662 Stiffness of left knee, not elsewhere classified: Secondary | ICD-10-CM | POA: Diagnosis not present

## 2017-09-29 DIAGNOSIS — Z96652 Presence of left artificial knee joint: Secondary | ICD-10-CM | POA: Diagnosis not present

## 2017-09-30 DIAGNOSIS — E113291 Type 2 diabetes mellitus with mild nonproliferative diabetic retinopathy without macular edema, right eye: Secondary | ICD-10-CM | POA: Diagnosis not present

## 2017-09-30 DIAGNOSIS — Z0000 Encounter for general adult medical examination without abnormal findings: Secondary | ICD-10-CM | POA: Diagnosis not present

## 2017-09-30 DIAGNOSIS — I10 Essential (primary) hypertension: Secondary | ICD-10-CM | POA: Diagnosis not present

## 2017-09-30 DIAGNOSIS — E782 Mixed hyperlipidemia: Secondary | ICD-10-CM | POA: Diagnosis not present

## 2017-10-06 DIAGNOSIS — M25562 Pain in left knee: Secondary | ICD-10-CM | POA: Diagnosis not present

## 2017-10-06 DIAGNOSIS — Z96652 Presence of left artificial knee joint: Secondary | ICD-10-CM | POA: Diagnosis not present

## 2017-10-06 DIAGNOSIS — M25662 Stiffness of left knee, not elsewhere classified: Secondary | ICD-10-CM | POA: Diagnosis not present

## 2017-10-13 DIAGNOSIS — F324 Major depressive disorder, single episode, in partial remission: Secondary | ICD-10-CM | POA: Diagnosis not present

## 2017-10-13 DIAGNOSIS — M25662 Stiffness of left knee, not elsewhere classified: Secondary | ICD-10-CM | POA: Diagnosis not present

## 2017-10-13 DIAGNOSIS — Z96652 Presence of left artificial knee joint: Secondary | ICD-10-CM | POA: Diagnosis not present

## 2017-10-13 DIAGNOSIS — M25562 Pain in left knee: Secondary | ICD-10-CM | POA: Diagnosis not present

## 2017-10-14 DIAGNOSIS — Z96652 Presence of left artificial knee joint: Secondary | ICD-10-CM | POA: Diagnosis not present

## 2017-10-14 DIAGNOSIS — M25562 Pain in left knee: Secondary | ICD-10-CM | POA: Diagnosis not present

## 2017-10-14 DIAGNOSIS — Z471 Aftercare following joint replacement surgery: Secondary | ICD-10-CM | POA: Diagnosis not present

## 2017-11-05 DIAGNOSIS — F324 Major depressive disorder, single episode, in partial remission: Secondary | ICD-10-CM | POA: Diagnosis not present

## 2017-11-25 DIAGNOSIS — F324 Major depressive disorder, single episode, in partial remission: Secondary | ICD-10-CM | POA: Diagnosis not present

## 2017-12-15 DIAGNOSIS — F324 Major depressive disorder, single episode, in partial remission: Secondary | ICD-10-CM | POA: Diagnosis not present

## 2017-12-28 DIAGNOSIS — E890 Postprocedural hypothyroidism: Secondary | ICD-10-CM | POA: Diagnosis not present

## 2017-12-28 DIAGNOSIS — C73 Malignant neoplasm of thyroid gland: Secondary | ICD-10-CM | POA: Diagnosis not present

## 2017-12-31 DIAGNOSIS — N951 Menopausal and female climacteric states: Secondary | ICD-10-CM | POA: Diagnosis not present

## 2017-12-31 DIAGNOSIS — C73 Malignant neoplasm of thyroid gland: Secondary | ICD-10-CM | POA: Diagnosis not present

## 2017-12-31 DIAGNOSIS — E890 Postprocedural hypothyroidism: Secondary | ICD-10-CM | POA: Diagnosis not present

## 2018-01-07 ENCOUNTER — Other Ambulatory Visit: Payer: Self-pay | Admitting: Endocrinology

## 2018-01-07 DIAGNOSIS — Z1231 Encounter for screening mammogram for malignant neoplasm of breast: Secondary | ICD-10-CM

## 2018-01-07 DIAGNOSIS — N951 Menopausal and female climacteric states: Secondary | ICD-10-CM

## 2018-01-27 DIAGNOSIS — Z09 Encounter for follow-up examination after completed treatment for conditions other than malignant neoplasm: Secondary | ICD-10-CM | POA: Diagnosis not present

## 2018-01-27 DIAGNOSIS — M25562 Pain in left knee: Secondary | ICD-10-CM | POA: Diagnosis not present

## 2018-01-27 DIAGNOSIS — Z96652 Presence of left artificial knee joint: Secondary | ICD-10-CM | POA: Diagnosis not present

## 2018-02-03 DIAGNOSIS — F324 Major depressive disorder, single episode, in partial remission: Secondary | ICD-10-CM | POA: Diagnosis not present

## 2018-03-09 DIAGNOSIS — F324 Major depressive disorder, single episode, in partial remission: Secondary | ICD-10-CM | POA: Diagnosis not present

## 2018-03-30 DIAGNOSIS — E890 Postprocedural hypothyroidism: Secondary | ICD-10-CM | POA: Diagnosis not present

## 2018-03-31 DIAGNOSIS — F324 Major depressive disorder, single episode, in partial remission: Secondary | ICD-10-CM | POA: Diagnosis not present

## 2018-04-27 DIAGNOSIS — F324 Major depressive disorder, single episode, in partial remission: Secondary | ICD-10-CM | POA: Diagnosis not present

## 2018-05-21 DIAGNOSIS — F324 Major depressive disorder, single episode, in partial remission: Secondary | ICD-10-CM | POA: Diagnosis not present

## 2018-06-09 DIAGNOSIS — F324 Major depressive disorder, single episode, in partial remission: Secondary | ICD-10-CM | POA: Diagnosis not present

## 2018-06-23 ENCOUNTER — Other Ambulatory Visit: Payer: Self-pay | Admitting: Cardiovascular Disease

## 2018-07-07 DIAGNOSIS — F324 Major depressive disorder, single episode, in partial remission: Secondary | ICD-10-CM | POA: Diagnosis not present

## 2018-07-19 ENCOUNTER — Other Ambulatory Visit: Payer: Self-pay | Admitting: Cardiovascular Disease

## 2018-07-21 DIAGNOSIS — M1711 Unilateral primary osteoarthritis, right knee: Secondary | ICD-10-CM | POA: Diagnosis not present

## 2018-07-21 DIAGNOSIS — M1712 Unilateral primary osteoarthritis, left knee: Secondary | ICD-10-CM | POA: Diagnosis not present

## 2018-07-25 ENCOUNTER — Other Ambulatory Visit: Payer: Self-pay | Admitting: Cardiovascular Disease

## 2018-07-27 NOTE — Progress Notes (Signed)
Patient ID: Natalie Hensley, female   DOB: [birth year], 62 y.o.   MRN: [id number]   This is a 62 y.o. patient had an MI in [place] followed by a CABG on 07/27/13 with a LIMA to the LAD, SVG to the RCA, and SVG to the diagonal 1. EF was normal. Postop course was complicated by sternal wound infection treated with antibiotics and wound VAC.    Seeing therapist for anxiety. Better on meds  Stress as a Teaching laboratory technician at [employer]    Has neuropathic pain and small keloid at sternotomy sight   Left TKR last year complicated by LLE DVT Completed 6 months anticoagulation Needs right TKR now Discussed using eliquis for DVT prophylaxis as only ASA used last year   ROS: Denies fever, malais, weight loss, blurry vision, decreased visual acuity, cough, sputum, SOB, hemoptysis, pleuritic pain, palpitaitons, heartburn, abdominal pain, melena, lower extremity edema, claudication, or rash.  All other systems reviewed and negative  General: BP 136/86   Pulse 77   Ht 5\' 8"  (1.727 m)   Wt 194 lb (88 kg)   SpO2 96%   BMI 29.50 kg/m  Affect appropriate Healthy:  appears stated age 20: normal Neck supple with no adenopathy JVP normal no bruits no thyromegaly Lungs clear with no wheezing and good diaphragmatic motion Heart:  S1/S2 no murmur, no rub, gallop or click PMI normal Abdomen: benighn, BS positve, no tenderness, no AAA no bruit.  No HSM or HJR Distal pulses intact with no bruits No edema Neuro non-focal Skin warm and dry Post left TKR      Current Outpatient Medications  Medication Sig Dispense Refill  . ALPRAZolam (XANAX) 0.25 MG tablet Take 0.125 mg by mouth daily as needed for anxiety.     Marland Kitchen aspirin EC 81 MG tablet Take 81 mg by mouth daily.    . busPIRone (BUSPAR) 10 MG tablet Take 10 mg by mouth 2 (two) times daily.    Marland Kitchen ezetimibe (ZETIA) 10 MG tablet Take 1 tablet (10 mg total) by mouth daily. 90 tablet 3  . levothyroxine (SYNTHROID, LEVOTHROID) 125 MCG tablet Take 125 mcg by  mouth daily before breakfast.    . metoprolol (LOPRESSOR) 50 MG tablet Take 12.5 mg by mouth 2 (two) times daily.  3  . nitroGLYCERIN (NITROSTAT) 0.4 MG SL tablet PLACE 1 TABLET UNDER TONGUE EVERY 5 MINUTES AS NEEDED FOR CHEST PAIN 25 tablet 3  . omeprazole (PRILOSEC) 20 MG capsule Take 20 mg by mouth daily.    . simvastatin (ZOCOR) 40 MG tablet TAKE 1 TABLET BY MOUTH EVERY DAY IN THE EVENING 90 tablet 3   No current facility-administered medications for this visit.     Allergies  Sulfa antibiotics  Electrocardiogram:  SR rate 96  Poor R wave progression cannot r/o anterior MI  07/17/15  SR rate 62  Normal  06/25/17 SR rate 73 LAE low voltage   Assessment and Plan  CAD/CABG:  07/27/13 CABG with LIMA to LAD, SVG to D1 and SVG RCA.  Non ischemic myovue  07/01/17   Continue asa and beta blocker   Chol: update labs with primary continue statin  Lab Results  Component Value Date   LDLCALC 83 07/11/2015   Thyroid:  Stable on replacement  Labs with Dr Soyla Murphy  DM:  Discussed low carb diet.  Target hemoglobin A1c is 6.5 or less.  Continue current medications. GERD:  Improved continue prilosec Anxiety:  F/u therapist  Ok to use HPTA and  other natural remedies  On Buspar now   Ortho:  Post left TKR done by Dr Novella Olive 07/21/17 with post op DVT but only took asa. Clear to have right TKR but will need eliquis for DVT prophylaxis post op   Jenkins Rouge

## 2018-07-29 ENCOUNTER — Other Ambulatory Visit: Payer: Self-pay | Admitting: Orthopaedic Surgery

## 2018-08-04 ENCOUNTER — Encounter: Payer: Self-pay | Admitting: Cardiovascular Disease

## 2018-08-04 ENCOUNTER — Encounter

## 2018-08-04 ENCOUNTER — Ambulatory Visit: Payer: Federal, State, Local not specified - PPO | Admitting: Cardiovascular Disease

## 2018-08-04 VITALS — BP 136/86 | HR 77 | Ht 68.0 in | Wt 194.0 lb

## 2018-08-04 DIAGNOSIS — Z951 Presence of aortocoronary bypass graft: Secondary | ICD-10-CM

## 2018-08-04 DIAGNOSIS — Z01818 Encounter for other preprocedural examination: Secondary | ICD-10-CM

## 2018-08-04 DIAGNOSIS — I25810 Atherosclerosis of coronary artery bypass graft(s) without angina pectoris: Secondary | ICD-10-CM

## 2018-08-04 MED ORDER — EZETIMIBE 10 MG PO TABS: 10.0000 mg | ORAL_TABLET | Freq: Every day | ORAL | 3 refills | Status: DC

## 2018-08-04 MED ORDER — SIMVASTATIN 40 MG PO TABS: ORAL_TABLET | ORAL | 3 refills | Status: DC

## 2018-08-04 NOTE — Patient Instructions (Addendum)

## 2018-08-17 NOTE — Pre-Procedure Instructions (Signed)
Natalie Hensley  08/17/2018      [id number] [place], [place] - [address] [place] [address] Phone: [phone] Fax: [phone]    Your procedure is scheduled on Tuesday October 22nd.  Report to [facility] Admitting at [place].M.  Call this number if you have problems the morning of surgery:  [phone]   Remember:  Do not eat or drink after midnight.     Take these medicines the morning of surgery with A SIP OF WATER   Xanax (if needed)  Buspar  Zetia   Synthroid  Metoprolol  7 days prior to surgery STOP taking any Aspirin(unless otherwise instructed by your surgeon), Aleve, Naproxen, Ibuprofen, Motrin, Advil, Goody's, BC's, all herbal medications, fish oil, and all vitamins     Do not wear jewelry, make-up or nail polish.  Do not wear lotions, powders, or perfumes, or deodorant.  Do not shave 48 hours prior to surgery.  Men may shave face and neck.  Do not bring valuables to the hospital.  [facility] is not responsible for any belongings or valuables.  Contacts, dentures or bridgework may not be worn into surgery.  Leave your suitcase in the car.  After surgery it may be brought to your room.  For patients admitted to the hospital, discharge time will be determined by your treatment team.  Patients discharged the day of surgery will not be allowed to drive home.    [place]- Preparing For Surgery  Before surgery, you can play an important role. Because skin is not sterile, your skin needs to be as free of germs as possible. You can reduce the number of germs on your skin by washing with CHG (chlorahexidine gluconate) Soap before surgery.  CHG is an antiseptic cleaner which kills germs and bonds with the skin to continue killing germs even after washing.    Oral Hygiene is also important to reduce your risk of infection.  Remember - BRUSH YOUR TEETH THE MORNING OF SURGERY WITH YOUR REGULAR  TOOTHPASTE  Please do not use if you have an allergy to CHG or antibacterial soaps. If your skin becomes reddened/irritated stop using the CHG.  Do not shave (including legs and underarms) for at least 48 hours prior to first CHG shower. It is OK to shave your face.  Please follow these instructions carefully.   1. Shower the NIGHT BEFORE SURGERY and the MORNING OF SURGERY with CHG.   2. If you chose to wash your hair, wash your hair first as usual with your normal shampoo.  3. After you shampoo, rinse your hair and body thoroughly to remove the shampoo.  4. Use CHG as you would any other liquid soap. You can apply CHG directly to the skin and wash gently with a scrungie or a clean washcloth.   5. Apply the CHG Soap to your body ONLY FROM THE NECK DOWN.  Do not use on open wounds or open sores. Avoid contact with your eyes, ears, mouth and genitals (private parts). Wash Face and genitals (private parts)  with your normal soap.  6. Wash thoroughly, paying special attention to the area where your surgery will be performed.  7. Thoroughly rinse your body with warm water from the neck down.  8. DO NOT shower/wash with your normal soap after using and rinsing off the CHG Soap.  9. Pat yourself dry with a CLEAN TOWEL.  10. Wear CLEAN PAJAMAS to bed the night  before surgery, wear comfortable clothes the morning of surgery  11. Place CLEAN SHEETS on your bed the night of your first shower and DO NOT SLEEP WITH PETS.    Day of Surgery:  Do not apply any deodorants/lotions.  Please wear clean clothes to the hospital/surgery center.   Remember to brush your teeth WITH YOUR REGULAR TOOTHPASTE.    Please read over the following fact sheets that you were given.

## 2018-08-18 ENCOUNTER — Other Ambulatory Visit: Payer: Self-pay

## 2018-08-18 ENCOUNTER — Encounter (HOSPITAL_COMMUNITY)
Admission: RE | Admit: 2018-08-18 | Discharge: 2018-08-18 | Disposition: A | Discharge status: Home / Self Care | Payer: Federal, State, Local not specified - PPO | Source: Ambulatory Visit | Attending: Orthopaedic Surgery | Admitting: Orthopaedic Surgery

## 2018-08-18 ENCOUNTER — Encounter (HOSPITAL_COMMUNITY): Payer: Self-pay

## 2018-08-18 DIAGNOSIS — E119 Type 2 diabetes mellitus without complications: Secondary | ICD-10-CM | POA: Diagnosis not present

## 2018-08-18 DIAGNOSIS — Z01812 Encounter for preprocedural laboratory examination: Secondary | ICD-10-CM | POA: Diagnosis not present

## 2018-08-18 DIAGNOSIS — I2510 Atherosclerotic heart disease of native coronary artery without angina pectoris: Secondary | ICD-10-CM | POA: Diagnosis not present

## 2018-08-18 DIAGNOSIS — I252 Old myocardial infarction: Secondary | ICD-10-CM | POA: Diagnosis not present

## 2018-08-18 DIAGNOSIS — Z79899 Other long term (current) drug therapy: Secondary | ICD-10-CM | POA: Diagnosis not present

## 2018-08-18 DIAGNOSIS — E785 Hyperlipidemia, unspecified: Secondary | ICD-10-CM | POA: Diagnosis not present

## 2018-08-18 DIAGNOSIS — M1711 Unilateral primary osteoarthritis, right knee: Secondary | ICD-10-CM | POA: Insufficient documentation

## 2018-08-18 DIAGNOSIS — E039 Hypothyroidism, unspecified: Secondary | ICD-10-CM | POA: Insufficient documentation

## 2018-08-18 DIAGNOSIS — Z79890 Hormone replacement therapy (postmenopausal): Secondary | ICD-10-CM | POA: Diagnosis not present

## 2018-08-18 DIAGNOSIS — I10 Essential (primary) hypertension: Secondary | ICD-10-CM | POA: Diagnosis not present

## 2018-08-18 DIAGNOSIS — K219 Gastro-esophageal reflux disease without esophagitis: Secondary | ICD-10-CM | POA: Insufficient documentation

## 2018-08-18 LAB — CBC WITH DIFFERENTIAL/PLATELET
Abs Immature Granulocytes: 0.02 10*3/uL (ref 0.00–0.07)
BASOS ABS: 0.1 10*3/uL (ref 0.0–0.1)
BASOS PCT: 1 %
EOS ABS: 0.4 10*3/uL (ref 0.0–0.5)
EOS PCT: 6 %
HCT: 48.1 % — ABNORMAL HIGH (ref 36.0–46.0)
Hemoglobin: 15.3 g/dL — ABNORMAL HIGH (ref 12.0–15.0)
IMMATURE GRANULOCYTES: 0 %
Lymphocytes Relative: 27 %
Lymphs Abs: 1.6 10*3/uL (ref 0.7–4.0)
MCH: 30.2 pg (ref 26.0–34.0)
MCHC: 31.8 g/dL (ref 30.0–36.0)
MCV: 95.1 fL (ref 80.0–100.0)
Monocytes Absolute: 0.6 10*3/uL (ref 0.1–1.0)
Monocytes Relative: 10 %
NRBC: 0 % (ref 0.0–0.2)
Neutro Abs: 3.3 10*3/uL (ref 1.7–7.7)
Neutrophils Relative %: 56 %
PLATELETS: 246 10*3/uL (ref 150–400)
RBC: 5.06 MIL/uL (ref 3.87–5.11)
RDW: 12.3 % (ref 11.5–15.5)
WBC: 5.9 10*3/uL (ref 4.0–10.5)

## 2018-08-18 LAB — TYPE AND SCREEN
ABO/RH(D): B NEG
Antibody Screen: NEGATIVE

## 2018-08-18 LAB — PROTIME-INR
INR: 0.98
Prothrombin Time: 12.9 seconds (ref 11.4–15.2)

## 2018-08-18 LAB — BASIC METABOLIC PANEL
Anion gap: 5 (ref 5–15)
BUN: 14 mg/dL (ref 8–23)
CALCIUM: 9.2 mg/dL (ref 8.9–10.3)
CO2: 27 mmol/L (ref 22–32)
Chloride: 106 mmol/L (ref 98–111)
Creatinine, Ser: 0.65 mg/dL (ref 0.44–1.00)
Glucose, Bld: 114 mg/dL — ABNORMAL HIGH (ref 70–99)
Potassium: 4.3 mmol/L (ref 3.5–5.1)
SODIUM: 138 mmol/L (ref 135–145)

## 2018-08-18 LAB — URINALYSIS, ROUTINE W REFLEX MICROSCOPIC
Bilirubin Urine: NEGATIVE
Glucose, UA: NEGATIVE mg/dL
Hgb urine dipstick: NEGATIVE
KETONES UR: NEGATIVE mg/dL
LEUKOCYTES UA: NEGATIVE
NITRITE: NEGATIVE
PH: 5 (ref 5.0–8.0)
Protein, ur: NEGATIVE mg/dL
SPECIFIC GRAVITY, URINE: 1.008 (ref 1.005–1.030)

## 2018-08-18 LAB — APTT: APTT: 29 s (ref 24–36)

## 2018-08-18 LAB — SURGICAL PCR SCREEN
MRSA, PCR: NEGATIVE
Staphylococcus aureus: NEGATIVE

## 2018-08-18 LAB — GLUCOSE, CAPILLARY: Glucose-Capillary: 168 mg/dL — ABNORMAL HIGH (ref 70–99)

## 2018-08-18 NOTE — Progress Notes (Signed)
PCP -  Maurice Small MD Cardiologist -  Jenkins Rouge MD  EKG - 08/04/18  Stress Test - 2018 ECHO -  2010  Fasting Blood Sugar - 120s Checks Blood Sugar every 3 months or so  Aspirin Instructions: Will Call Surgeon's office for instructions.  Anesthesia review: past 2 EKGs are the same but abnormal. Pt has been cleared by her cardiologist however.  Patient denies shortness of breath, fever, cough and chest pain at PAT appointment   Patient verbalized understanding of instructions that were given to them at the PAT appointment. Patient was also instructed that they will need to review over the PAT instructions again at home before surgery.

## 2018-08-19 NOTE — Progress Notes (Addendum)
Anesthesia Chart Review:  Case:  [id number] Date/Time:  08/31/18 0715   Procedure:  RIGHT TOTAL KNEE ARTHROPLASTY (Right Knee)   Anesthesia type:  Spinal   Pre-op diagnosis:  RIGHT KNEE DEGENERATIVE JOINT DISEASE   Location:  [facility] OR ROOM 05 / [facility]   Surgeon:  Melrose Nakayama, MD      DISCUSSION: Patient is a 62 year old female scheduled for the above procedure.  History includes never smoker, post-operative N/V, CAD (MI; "CABG 07/27/13 by Dr Noralee Stain. [place] to LAD SVG to RCA and SVG to D1.  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  Post op course complicated by sternal wound infection"), HTN, DM2, hyperlipidemia, thyroid cancer (s/p RAI 2011; post-RAI hypothyroidism), GERD, left TKA (complicated by LLE PT vein DVT 07/27/17, s/p anticoagulation x6 monoths).   Per cardiologist Dr. Johnsie Cancel, "Post left TKR done by Dr Novella Olive 07/21/17 with post op DVT but only took asa. Clear to have right TKR but will need eliquis for DVT prophylaxis post op."   CXR was not done at PAT. I have notified Juliann Pulse at Dr. Jerald Kief office asking that if CXR needed prior to the day of surgery then to contact our scheduler to bring patient back in and that if decision was to cancel CXR all together then an order would be needed. (UPDATE 08/23/18 2:18 PM: Per Juliann Pulse, Dr. Rhona Raider is okay with not getting a preoperative chest xray if patient does not meet anesthesia requirements for CXR. If no acute pulmonary or infections symptoms then she would not need a preoperative CXR by anesthesia guide.ines.)   If no acute changes then I anticipate that she can proceed as planned.   VS: BP (!) 143/81   Pulse 62   Temp 36.9 C (Oral)   Resp 18   Ht 5\' 8"  (1.727 m)   Wt 88.3 kg   SpO2 98%   BMI 29.60 kg/m   PROVIDERS: Maurice Small, MD is PCP Jenkins Rouge, MD is cardiologist. Last visit 08/04/18.    LABS: Labs reviewed: Acceptable for surgery. (all labs ordered are listed, but only abnormal results are  displayed)  Labs Reviewed  GLUCOSE, CAPILLARY - Abnormal; Notable for the following components:      Result Value   Glucose-Capillary 168 (*)    All other components within normal limits  BASIC METABOLIC PANEL - Abnormal; Notable for the following components:   Glucose, Bld 114 (*)    All other components within normal limits  CBC WITH DIFFERENTIAL/PLATELET - Abnormal; Notable for the following components:   Hemoglobin 15.3 (*)    HCT 48.1 (*)    All other components within normal limits  SURGICAL PCR SCREEN  APTT  PROTIME-INR  URINALYSIS, ROUTINE W REFLEX MICROSCOPIC  TYPE AND SCREEN    IMAGES: CXR ordered by surgeon, but does not appear to have been done at PAT. See discussion.    EKG: 08/04/18 ([facility]): NSR, possible LAE. Cannot rule out inferior infarct, age undetermined. Cannot rule out anterior infarct, age undetermined.    CV: Nuclear stress test 07/01/17:   Nuclear stress EF: 77%.  There was no ST segment deviation noted during stress.  Findings consistent with prior myocardial infarction.  This is a low risk study.  The left ventricular ejection fraction is hyperdynamic (>65%). Distal antero apical infarct no ischemia Wall motion normal EF 77%  No ischemia  Echo 10/01/09: Impressions: - trechnically poor study.  No significant valvular disease seen (mild MR). Nl. LV systolic  function.   Past Medical History:  Diagnosis Date  . Anxiety   . Arthritis    bilateral knees, right hip  . Carpal tunnel syndrome   . Complication of anesthesia   . Coronary artery disease   . Depression   . Diabetes ([place])   . Family history of adverse reaction to anesthesia    SISTER HAS NAUSEA   . GERD (gastroesophageal reflux disease)   . HTN (hypertension)   . Hyperlipidemia   . Hypothyroidism    No thyroid  . Myocardial infarction ([place])   . PONV (postoperative nausea and vomiting)   . Thyroid cancer ([place])   . Urinary frequency     Past Surgical  History:  Procedure Laterality Date  . APPENDECTOMY    . bone infection  Left    left shin surgery by Dr Rhona Raider  . CHOLECYSTECTOMY    . COLONOSCOPY    . CORONARY ARTERY BYPASS GRAFT  07/27/2013  . LAPAROSCOPIC APPENDECTOMY    . TOTAL ABDOMINAL HYSTERECTOMY    . TOTAL KNEE ARTHROPLASTY Left 07/21/2017   Procedure: TOTAL KNEE ARTHROPLASTY;  Surgeon: Melrose Nakayama, MD;  Location: [facility];  Service: Orthopedics;  Laterality: Left;    MEDICATIONS: . ALPRAZolam (XANAX) 0.25 MG tablet  . aspirin EC 81 MG tablet  . busPIRone (BUSPAR) 10 MG tablet  . ezetimibe (ZETIA) 10 MG tablet  . levothyroxine (SYNTHROID, LEVOTHROID) 125 MCG tablet  . metoprolol (LOPRESSOR) 50 MG tablet  . nitroGLYCERIN (NITROSTAT) 0.4 MG SL tablet  . omeprazole (PRILOSEC) 20 MG capsule  . simvastatin (ZOCOR) 40 MG tablet   No current facility-administered medications for this encounter.     George Hugh [facility] Short Stay Center/Anesthesiology Phone [phone] 08/19/2018 5:59 PM

## 2018-08-26 NOTE — H&P (Signed)
TOTAL KNEE ADMISSION H&P  Patient is being admitted for right total knee arthroplasty.  Subjective:  Chief Complaint:right knee pain.  HPI: Natalie Hensley, 62 y.o. female, has a history of pain and functional disability in the right knee due to arthritis and has failed non-surgical conservative treatments for greater than 12 weeks to includeNSAID's and/or analgesics, corticosteriod injections, flexibility and strengthening excercises, supervised PT with diminished ADL's post treatment, use of assistive devices, weight reduction as appropriate and activity modification.  Onset of symptoms was gradual, starting 5 years ago with gradually worsening course since that time. The patient noted prior procedures on the knee to include  arthroscopy on the right knee(s).  Patient currently rates pain in the right knee(s) at 10 out of 10 with activity. Patient has night pain, worsening of pain with activity and weight bearing, pain that interferes with activities of daily living, crepitus and joint swelling.  Patient has evidence of subchondral cysts, subchondral sclerosis, periarticular osteophytes and joint space narrowing by imaging studies.  There is no active infection.  Patient Active Problem List   Diagnosis Date Noted  . Primary localized osteoarthritis of left knee 07/21/2017  . Primary osteoarthritis of left knee 07/21/2017  . Chest pain 02/22/2014  . Myocardial infarction ([place])   . Hyperlipidemia   . HTN (hypertension)   . Urinary frequency   . Depression   . Thyroid cancer ([place])   . Anxiety   . Diabetes ([place])   . Goiter   . Hypothyroidism   . Carpal tunnel syndrome    Past Medical History:  Diagnosis Date  . Anxiety   . Arthritis    bilateral knees, right hip  . Carpal tunnel syndrome   . Complication of anesthesia   . Coronary artery disease   . Depression   . Diabetes ([place])   . Family history of adverse reaction to anesthesia    SISTER HAS NAUSEA   . GERD (gastroesophageal  reflux disease)   . HTN (hypertension)   . Hyperlipidemia   . Hypothyroidism    No thyroid  . Myocardial infarction ([place])   . PONV (postoperative nausea and vomiting)   . Thyroid cancer ([place])   . Urinary frequency     Past Surgical History:  Procedure Laterality Date  . APPENDECTOMY    . bone infection  Left    left shin surgery by Dr Rhona Raider  . CHOLECYSTECTOMY    . COLONOSCOPY    . CORONARY ARTERY BYPASS GRAFT  07/27/2013  . LAPAROSCOPIC APPENDECTOMY    . TOTAL ABDOMINAL HYSTERECTOMY    . TOTAL KNEE ARTHROPLASTY Left 07/21/2017   Procedure: TOTAL KNEE ARTHROPLASTY;  Surgeon: Melrose Nakayama, MD;  Location: [facility];  Service: Orthopedics;  Laterality: Left;    No current facility-administered medications for this encounter.    Current Outpatient Medications  Medication Sig Dispense Refill Last Dose  . ALPRAZolam (XANAX) 0.25 MG tablet Take 0.125 mg by mouth daily as needed for anxiety.    Taking  . aspirin EC 81 MG tablet Take 81 mg by mouth daily.   Taking  . busPIRone (BUSPAR) 10 MG tablet Take 10 mg by mouth 2 (two) times daily.   Taking  . ezetimibe (ZETIA) 10 MG tablet Take 1 tablet (10 mg total) by mouth daily. 90 tablet 3   . levothyroxine (SYNTHROID, LEVOTHROID) 125 MCG tablet Take 125 mcg by mouth daily before breakfast.   Taking  . metoprolol (LOPRESSOR) 50 MG tablet Take 12.5 mg by mouth 2 (  two) times daily.  3 Taking  . nitroGLYCERIN (NITROSTAT) 0.4 MG SL tablet PLACE 1 TABLET UNDER TONGUE EVERY 5 MINUTES AS NEEDED FOR CHEST PAIN 25 tablet 3 Taking  . omeprazole (PRILOSEC) 20 MG capsule Take 20 mg by mouth at bedtime.    Taking  . simvastatin (ZOCOR) 40 MG tablet TAKE 1 TABLET BY MOUTH EVERY DAY IN THE EVENING 90 tablet 3    Allergies  Allergen Reactions  . Sulfa Antibiotics Rash    Social History   Tobacco Use  . Smoking status: Never Smoker  . Smokeless tobacco: Never Used  Substance Use Topics  . Alcohol use: Yes    Comment: social    Family History   Problem Relation Age of Onset  . CAD Father   . Hypertension Father   . Heart disease Father   . Diabetes Brother   . Cancer Brother   . Cancer Mother   . Cancer Sister   . Hypertension Sister   . Heart disease Paternal Grandmother   . Hyperlipidemia Paternal Grandmother   . Heart failure Paternal Grandmother   . Coronary artery disease Paternal Grandfather   . Alzheimer's disease Maternal Grandmother   . Heart attack Maternal Grandfather      Review of Systems  Musculoskeletal: Positive for joint pain.       Right knee  All other systems reviewed and are negative.   Objective:  Physical Exam  Constitutional: She is oriented to person, place, and time. She appears well-developed and well-nourished.  HENT:  Head: Normocephalic and atraumatic.  Eyes: Pupils are equal, round, and reactive to light.  Neck: Normal range of motion.  Cardiovascular: Normal rate and regular rhythm.  Respiratory: Effort normal.  GI: Soft.  Musculoskeletal:  Replaced left knee has benign scar with no effusion and motion from 0-110.  She has good stability in extension.  Opposite knee has a mild varus deformity with motion from 0-120.  There is no effusion and she has no surgical scars there.  Calves are soft and nontender.  She has intact sensation and motor function in her feet and palpable pulses on both sides.   Neurological: She is alert and oriented to person, place, and time.  Skin: Skin is warm and dry.  Psychiatric: She has a normal mood and affect. Her behavior is normal. Judgment and thought content normal.    Vital signs in last 24 hours:    Labs:   Estimated body mass index is 29.6 kg/m as calculated from the following:   Height as of 08/18/18: 5\' 8"  (1.727 m).   Weight as of 08/18/18: 88.3 kg.   Imaging Review Plain radiographs demonstrate severe degenerative joint disease of the right knee(s). The overall alignment isneutral. The bone quality appears to be good for age and  reported activity level.   Preoperative templating of the joint replacement has been completed, documented, and submitted to the Operating Room personnel in order to optimize intra-operative equipment management.    Patient's anticipated LOS is less than 2 midnights, meeting these requirements: - Younger than 46 - Lives within 1 hour of care - Has a competent adult at home to recover with post-op recover - NO history of  - Chronic pain requiring opiods  - Diabetes  - Coronary Artery Disease  - Heart failure  - Heart attack  - Stroke  - DVT/VTE  - Cardiac arrhythmia  - Respiratory Failure/COPD  - Renal failure  - Anemia  - Advanced Liver disease  Assessment/Plan:  End stage primaryarthritis, right knee   The patient history, physical examination, clinical judgment of the provider and imaging studies are consistent with end stage degenerative joint disease of the right knee(s) and total knee arthroplasty is deemed medically necessary. The treatment options including medical management, injection therapy arthroscopy and arthroplasty were discussed at length. The risks and benefits of total knee arthroplasty were presented and reviewed. The risks due to aseptic loosening, infection, stiffness, patella tracking problems, thromboembolic complications and other imponderables were discussed. The patient acknowledged the explanation, agreed to proceed with the plan and consent was signed. Patient is being admitted for inpatient treatment for surgery, pain control, PT, OT, prophylactic antibiotics, VTE prophylaxis, progressive ambulation and ADL's and discharge planning. The patient is planning to be discharged home with home health services

## 2018-08-30 MED ORDER — TRANEXAMIC ACID 1000 MG/10ML IV SOLN
2000.0000 mg | INTRAVENOUS | Status: AC
  Administered 2018-08-31: 2000 mg via TOPICAL
  Filled 2018-08-30: qty 20

## 2018-08-30 MED ORDER — TRANEXAMIC ACID-NACL 1000-0.7 MG/100ML-% IV SOLN
1000.0000 mg | INTRAVENOUS | Status: AC
  Administered 2018-08-31: 1000 mg via INTRAVENOUS
  Filled 2018-08-30: qty 100

## 2018-08-30 MED ORDER — BUPIVACAINE LIPOSOME 1.3 % IJ SUSP
20.0000 mL | INTRAMUSCULAR | Status: AC
  Administered 2018-08-31: 20 mL
  Filled 2018-08-30 (×2): qty 20

## 2018-08-30 NOTE — Anesthesia Preprocedure Evaluation (Addendum)
Anesthesia Evaluation  Patient identified by MRN, date of birth, ID band Patient awake    Reviewed: Allergy & Precautions, H&P , NPO status , Patient's Chart, lab work & pertinent test results  History of Anesthesia Complications (+) PONV and history of anesthetic complications  Airway Mallampati: III  TM Distance: >3 FB Neck ROM: Full    Dental  (+) Teeth Intact, Dental Advisory Given   Pulmonary    Pulmonary exam normal        Cardiovascular Exercise Tolerance: Good hypertension, Normal cardiovascular exam  Study Highlights     Nuclear stress EF: 77%.  There was no ST segment deviation noted during stress.  Findings consistent with prior myocardial infarction.  This is a low risk study.  The left ventricular ejection fraction is hyperdynamic (>65%).   Distal antero apical infarct no ischemia Wall motion normal EF 77%  No ischemia     Neuro/Psych PSYCHIATRIC DISORDERS Anxiety Depression negative neurological ROS     GI/Hepatic GERD  ,  Endo/Other  diabetesHypothyroidism   Renal/GU      Musculoskeletal  (+) Arthritis ,   Abdominal   Peds  Hematology negative hematology ROS (+)   Anesthesia Other Findings CAD   Normal EF DM  Reproductive/Obstetrics                           Anesthesia Physical  Anesthesia Plan  ASA: II  Anesthesia Plan: Spinal and MAC   Post-op Pain Management:    Induction:   PONV Risk Score and Plan: 2 and 3 and Ondansetron, Propofol infusion and Dexamethasone  Airway Management Planned: Natural Airway  Additional Equipment:   Intra-op Plan:   Post-operative Plan:   Informed Consent: I have reviewed the patients History and Physical, chart, labs and discussed the procedure including the risks, benefits and alternatives for the proposed anesthesia with the patient or authorized representative who has indicated his/her understanding and  acceptance.   Dental advisory given  Plan Discussed with: CRNA and Anesthesiologist  Anesthesia Plan Comments: (  )       Anesthesia Quick Evaluation

## 2018-08-31 ENCOUNTER — Other Ambulatory Visit: Payer: Self-pay

## 2018-08-31 ENCOUNTER — Ambulatory Visit (HOSPITAL_COMMUNITY): Payer: Federal, State, Local not specified - PPO | Admitting: Vascular Surgery

## 2018-08-31 ENCOUNTER — Encounter (HOSPITAL_COMMUNITY): Payer: Self-pay | Admitting: *Deleted

## 2018-08-31 ENCOUNTER — Observation Stay (HOSPITAL_COMMUNITY)
Admission: RE | Admit: 2018-08-31 | Discharge: 2018-09-02 | Disposition: A | Discharge status: Home / Self Care | Payer: Federal, State, Local not specified - PPO | Source: Ambulatory Visit | Attending: Orthopaedic Surgery | Admitting: Orthopaedic Surgery

## 2018-08-31 ENCOUNTER — Ambulatory Visit (HOSPITAL_COMMUNITY): Payer: Federal, State, Local not specified - PPO | Admitting: Anesthesiology

## 2018-08-31 ENCOUNTER — Encounter (HOSPITAL_COMMUNITY)
Admission: RE | Disposition: A | Discharge status: Home / Self Care | Payer: Self-pay | Source: Ambulatory Visit | Attending: Orthopaedic Surgery

## 2018-08-31 ENCOUNTER — Ambulatory Visit (HOSPITAL_COMMUNITY): Payer: Federal, State, Local not specified - PPO

## 2018-08-31 DIAGNOSIS — E119 Type 2 diabetes mellitus without complications: Secondary | ICD-10-CM | POA: Diagnosis not present

## 2018-08-31 DIAGNOSIS — K219 Gastro-esophageal reflux disease without esophagitis: Secondary | ICD-10-CM | POA: Diagnosis not present

## 2018-08-31 DIAGNOSIS — I252 Old myocardial infarction: Secondary | ICD-10-CM | POA: Insufficient documentation

## 2018-08-31 DIAGNOSIS — Z01818 Encounter for other preprocedural examination: Secondary | ICD-10-CM

## 2018-08-31 DIAGNOSIS — Z86718 Personal history of other venous thrombosis and embolism: Secondary | ICD-10-CM | POA: Insufficient documentation

## 2018-08-31 DIAGNOSIS — Z85850 Personal history of malignant neoplasm of thyroid: Secondary | ICD-10-CM | POA: Insufficient documentation

## 2018-08-31 DIAGNOSIS — Z882 Allergy status to sulfonamides status: Secondary | ICD-10-CM | POA: Diagnosis not present

## 2018-08-31 DIAGNOSIS — I2510 Atherosclerotic heart disease of native coronary artery without angina pectoris: Secondary | ICD-10-CM | POA: Diagnosis not present

## 2018-08-31 DIAGNOSIS — Z7982 Long term (current) use of aspirin: Secondary | ICD-10-CM | POA: Diagnosis not present

## 2018-08-31 DIAGNOSIS — F419 Anxiety disorder, unspecified: Secondary | ICD-10-CM | POA: Insufficient documentation

## 2018-08-31 DIAGNOSIS — Z951 Presence of aortocoronary bypass graft: Secondary | ICD-10-CM | POA: Insufficient documentation

## 2018-08-31 DIAGNOSIS — F329 Major depressive disorder, single episode, unspecified: Secondary | ICD-10-CM | POA: Insufficient documentation

## 2018-08-31 DIAGNOSIS — Z7901 Long term (current) use of anticoagulants: Secondary | ICD-10-CM | POA: Diagnosis not present

## 2018-08-31 DIAGNOSIS — E785 Hyperlipidemia, unspecified: Secondary | ICD-10-CM | POA: Insufficient documentation

## 2018-08-31 DIAGNOSIS — M1711 Unilateral primary osteoarthritis, right knee: Secondary | ICD-10-CM | POA: Diagnosis not present

## 2018-08-31 DIAGNOSIS — I10 Essential (primary) hypertension: Secondary | ICD-10-CM | POA: Diagnosis not present

## 2018-08-31 DIAGNOSIS — E039 Hypothyroidism, unspecified: Secondary | ICD-10-CM | POA: Diagnosis not present

## 2018-08-31 DIAGNOSIS — G8918 Other acute postprocedural pain: Secondary | ICD-10-CM | POA: Diagnosis not present

## 2018-08-31 DIAGNOSIS — Z79899 Other long term (current) drug therapy: Secondary | ICD-10-CM | POA: Diagnosis not present

## 2018-08-31 HISTORY — DX: Type 2 diabetes mellitus without complications: E11.9

## 2018-08-31 HISTORY — PX: TOTAL KNEE ARTHROPLASTY: SHX125

## 2018-08-31 LAB — GLUCOSE, CAPILLARY
GLUCOSE-CAPILLARY: 169 mg/dL — AB (ref 70–99)
Glucose-Capillary: 156 mg/dL — ABNORMAL HIGH (ref 70–99)

## 2018-08-31 SURGERY — ARTHROPLASTY, KNEE, TOTAL
Anesthesia: Monitor Anesthesia Care | Site: Knee | Laterality: Right

## 2018-08-31 MED ORDER — KETOROLAC TROMETHAMINE 15 MG/ML IJ SOLN
15.0000 mg | Freq: Four times a day (QID) | INTRAMUSCULAR | Status: AC
  Administered 2018-08-31 – 2018-09-01 (×4): 15 mg via INTRAVENOUS
  Filled 2018-08-31 (×4): qty 1

## 2018-08-31 MED ORDER — SIMVASTATIN 40 MG PO TABS
40.0000 mg | ORAL_TABLET | Freq: Every day | ORAL | Status: DC
  Administered 2018-08-31 – 2018-09-01 (×2): 40 mg via ORAL
  Filled 2018-08-31 (×2): qty 1

## 2018-08-31 MED ORDER — BUPIVACAINE-EPINEPHRINE 0.5% -1:200000 IJ SOLN
INTRAMUSCULAR | Status: AC
  Filled 2018-08-31: qty 1

## 2018-08-31 MED ORDER — TRANEXAMIC ACID-NACL 1000-0.7 MG/100ML-% IV SOLN
1000.0000 mg | Freq: Once | INTRAVENOUS | Status: AC
  Administered 2018-08-31: 1000 mg via INTRAVENOUS
  Filled 2018-08-31: qty 100

## 2018-08-31 MED ORDER — PHENOL 1.4 % MT LIQD: 1.0000 | OROMUCOSAL | Status: DC | PRN

## 2018-08-31 MED ORDER — PROMETHAZINE HCL 25 MG/ML IJ SOLN
6.2500 mg | INTRAMUSCULAR | Status: DC | PRN
  Administered 2018-08-31: 6.25 mg via INTRAVENOUS

## 2018-08-31 MED ORDER — FENTANYL CITRATE (PF) 250 MCG/5ML IJ SOLN
INTRAMUSCULAR | Status: AC
  Filled 2018-08-31: qty 5

## 2018-08-31 MED ORDER — APIXABAN 2.5 MG PO TABS
2.5000 mg | ORAL_TABLET | Freq: Two times a day (BID) | ORAL | Status: DC
  Administered 2018-09-01 – 2018-09-02 (×3): 2.5 mg via ORAL
  Filled 2018-08-31 (×3): qty 1

## 2018-08-31 MED ORDER — BISACODYL 5 MG PO TBEC: 5.0000 mg | DELAYED_RELEASE_TABLET | Freq: Every day | ORAL | Status: DC | PRN

## 2018-08-31 MED ORDER — PROPOFOL 1000 MG/100ML IV EMUL
INTRAVENOUS | Status: AC
  Filled 2018-08-31: qty 100

## 2018-08-31 MED ORDER — CEFAZOLIN SODIUM-DEXTROSE 2-4 GM/100ML-% IV SOLN
2.0000 g | Freq: Four times a day (QID) | INTRAVENOUS | Status: AC
  Administered 2018-08-31 (×2): 2 g via INTRAVENOUS
  Filled 2018-08-31 (×2): qty 100

## 2018-08-31 MED ORDER — EZETIMIBE 10 MG PO TABS
10.0000 mg | ORAL_TABLET | Freq: Every day | ORAL | Status: DC
  Administered 2018-09-01 – 2018-09-02 (×2): 10 mg via ORAL
  Filled 2018-08-31 (×2): qty 1

## 2018-08-31 MED ORDER — ONDANSETRON HCL 4 MG PO TABS: 4.0000 mg | ORAL_TABLET | Freq: Four times a day (QID) | ORAL | Status: DC | PRN

## 2018-08-31 MED ORDER — 0.9 % SODIUM CHLORIDE (POUR BTL) OPTIME
TOPICAL | Status: DC | PRN
  Administered 2018-08-31: 1000 mL

## 2018-08-31 MED ORDER — ONDANSETRON HCL 4 MG/2ML IJ SOLN
4.0000 mg | Freq: Four times a day (QID) | INTRAMUSCULAR | Status: DC | PRN
  Administered 2018-09-02: 4 mg via INTRAVENOUS

## 2018-08-31 MED ORDER — PROMETHAZINE HCL 25 MG/ML IJ SOLN
INTRAMUSCULAR | Status: AC
  Filled 2018-08-31: qty 1

## 2018-08-31 MED ORDER — FENTANYL CITRATE (PF) 250 MCG/5ML IJ SOLN
INTRAMUSCULAR | Status: DC | PRN
  Administered 2018-08-31: 50 ug via INTRAVENOUS
  Administered 2018-08-31: 100 ug via INTRAVENOUS

## 2018-08-31 MED ORDER — NITROGLYCERIN 0.4 MG SL SUBL: 0.4000 mg | SUBLINGUAL_TABLET | SUBLINGUAL | Status: DC | PRN

## 2018-08-31 MED ORDER — MORPHINE SULFATE (PF) 2 MG/ML IV SOLN
0.5000 mg | INTRAVENOUS | Status: DC | PRN
  Administered 2018-08-31: 1 mg via INTRAVENOUS
  Filled 2018-08-31: qty 1

## 2018-08-31 MED ORDER — PROPOFOL 500 MG/50ML IV EMUL
INTRAVENOUS | Status: DC | PRN
  Administered 2018-08-31: 50 ug/kg/min via INTRAVENOUS

## 2018-08-31 MED ORDER — CEFAZOLIN SODIUM-DEXTROSE 2-4 GM/100ML-% IV SOLN
2.0000 g | INTRAVENOUS | Status: AC
  Administered 2018-08-31: 2 g via INTRAVENOUS
  Filled 2018-08-31: qty 100

## 2018-08-31 MED ORDER — PANTOPRAZOLE SODIUM 40 MG PO TBEC
40.0000 mg | DELAYED_RELEASE_TABLET | Freq: Every day | ORAL | Status: DC
  Administered 2018-09-01 – 2018-09-02 (×2): 40 mg via ORAL
  Filled 2018-08-31 (×2): qty 1

## 2018-08-31 MED ORDER — METHOCARBAMOL 500 MG PO TABS
500.0000 mg | ORAL_TABLET | Freq: Four times a day (QID) | ORAL | Status: DC | PRN
  Administered 2018-08-31 – 2018-09-02 (×6): 500 mg via ORAL
  Filled 2018-08-31 (×6): qty 1

## 2018-08-31 MED ORDER — ACETAMINOPHEN 500 MG PO TABS
500.0000 mg | ORAL_TABLET | Freq: Four times a day (QID) | ORAL | Status: AC
  Administered 2018-08-31 – 2018-09-01 (×4): 500 mg via ORAL
  Filled 2018-08-31 (×5): qty 1

## 2018-08-31 MED ORDER — MIDAZOLAM HCL 2 MG/2ML IJ SOLN
INTRAMUSCULAR | Status: AC
  Filled 2018-08-31: qty 2

## 2018-08-31 MED ORDER — LEVOTHYROXINE SODIUM 25 MCG PO TABS
125.0000 ug | ORAL_TABLET | Freq: Every day | ORAL | Status: DC
  Administered 2018-09-01 – 2018-09-02 (×2): 125 ug via ORAL
  Filled 2018-08-31 (×2): qty 1

## 2018-08-31 MED ORDER — ROPIVACAINE HCL 7.5 MG/ML IJ SOLN
INTRAMUSCULAR | Status: DC | PRN
  Administered 2018-08-31: 20 mL via PERINEURAL

## 2018-08-31 MED ORDER — DEXAMETHASONE SODIUM PHOSPHATE 10 MG/ML IJ SOLN
INTRAMUSCULAR | Status: DC | PRN
  Administered 2018-08-31: 5 mg via INTRAVENOUS

## 2018-08-31 MED ORDER — MIDAZOLAM HCL 2 MG/2ML IJ SOLN
INTRAMUSCULAR | Status: DC | PRN
  Administered 2018-08-31: 2 mg via INTRAVENOUS

## 2018-08-31 MED ORDER — METOCLOPRAMIDE HCL 5 MG PO TABS: 5.0000 mg | ORAL_TABLET | Freq: Three times a day (TID) | ORAL | Status: DC | PRN

## 2018-08-31 MED ORDER — METHOCARBAMOL 1000 MG/10ML IJ SOLN
500.0000 mg | Freq: Four times a day (QID) | INTRAVENOUS | Status: DC | PRN
  Filled 2018-08-31: qty 5

## 2018-08-31 MED ORDER — METOCLOPRAMIDE HCL 5 MG/ML IJ SOLN: 5.0000 mg | Freq: Three times a day (TID) | INTRAMUSCULAR | Status: DC | PRN

## 2018-08-31 MED ORDER — BUPIVACAINE IN DEXTROSE 0.75-8.25 % IT SOLN
INTRATHECAL | Status: DC | PRN
  Administered 2018-08-31: 1.6 mg via INTRATHECAL

## 2018-08-31 MED ORDER — ONDANSETRON HCL 4 MG/2ML IJ SOLN
INTRAMUSCULAR | Status: DC | PRN
  Administered 2018-08-31: 4 mg via INTRAVENOUS

## 2018-08-31 MED ORDER — PROPOFOL 10 MG/ML IV BOLUS
INTRAVENOUS | Status: AC
  Filled 2018-08-31: qty 20

## 2018-08-31 MED ORDER — HYDROMORPHONE HCL 1 MG/ML IJ SOLN
1.0000 mg | INTRAMUSCULAR | Status: DC | PRN
  Administered 2018-09-01 (×2): 1 mg via INTRAVENOUS
  Filled 2018-08-31 (×3): qty 1

## 2018-08-31 MED ORDER — ACETAMINOPHEN 325 MG PO TABS: 325.0000 mg | ORAL_TABLET | Freq: Four times a day (QID) | ORAL | Status: DC | PRN

## 2018-08-31 MED ORDER — LACTATED RINGERS IV SOLN
INTRAVENOUS | Status: DC
  Administered 2018-08-31 (×2): via INTRAVENOUS

## 2018-08-31 MED ORDER — HYDROCODONE-ACETAMINOPHEN 7.5-325 MG PO TABS
1.0000 | ORAL_TABLET | ORAL | Status: DC | PRN
  Administered 2018-08-31 (×2): 1 via ORAL
  Administered 2018-09-02 (×3): 2 via ORAL
  Filled 2018-08-31: qty 2
  Filled 2018-08-31 (×2): qty 1
  Filled 2018-08-31 (×2): qty 2

## 2018-08-31 MED ORDER — MENTHOL 3 MG MT LOZG: 1.0000 | LOZENGE | OROMUCOSAL | Status: DC | PRN

## 2018-08-31 MED ORDER — DEXAMETHASONE SODIUM PHOSPHATE 10 MG/ML IJ SOLN
INTRAMUSCULAR | Status: AC
  Filled 2018-08-31: qty 1

## 2018-08-31 MED ORDER — SODIUM CHLORIDE 0.9% FLUSH
INTRAVENOUS | Status: DC | PRN
  Administered 2018-08-31: 10 mL

## 2018-08-31 MED ORDER — METOPROLOL TARTRATE 12.5 MG HALF TABLET
12.5000 mg | ORAL_TABLET | Freq: Two times a day (BID) | ORAL | Status: DC
  Administered 2018-08-31 – 2018-09-02 (×4): 12.5 mg via ORAL
  Filled 2018-08-31 (×4): qty 1

## 2018-08-31 MED ORDER — HYDROCODONE-ACETAMINOPHEN 5-325 MG PO TABS
1.0000 | ORAL_TABLET | ORAL | Status: DC | PRN
  Administered 2018-08-31 – 2018-09-02 (×5): 2 via ORAL
  Filled 2018-08-31 (×6): qty 2

## 2018-08-31 MED ORDER — ALPRAZOLAM 0.25 MG PO TABS: 0.1250 mg | ORAL_TABLET | Freq: Every day | ORAL | Status: DC | PRN

## 2018-08-31 MED ORDER — CHLORHEXIDINE GLUCONATE 4 % EX LIQD: 60.0000 mL | Freq: Once | CUTANEOUS | Status: DC

## 2018-08-31 MED ORDER — BUPIVACAINE-EPINEPHRINE (PF) 0.5% -1:200000 IJ SOLN
INTRAMUSCULAR | Status: DC | PRN
  Administered 2018-08-31: 30 mL via PERINEURAL

## 2018-08-31 MED ORDER — BUSPIRONE HCL 10 MG PO TABS
10.0000 mg | ORAL_TABLET | Freq: Two times a day (BID) | ORAL | Status: DC
  Administered 2018-08-31 – 2018-09-02 (×4): 10 mg via ORAL
  Filled 2018-08-31 (×4): qty 1

## 2018-08-31 MED ORDER — DIPHENHYDRAMINE HCL 12.5 MG/5ML PO ELIX: 12.5000 mg | ORAL_SOLUTION | ORAL | Status: DC | PRN

## 2018-08-31 MED ORDER — LACTATED RINGERS IV SOLN
INTRAVENOUS | Status: DC
  Administered 2018-08-31 (×2): via INTRAVENOUS

## 2018-08-31 MED ORDER — ALUM & MAG HYDROXIDE-SIMETH 200-200-20 MG/5ML PO SUSP: 30.0000 mL | ORAL | Status: DC | PRN

## 2018-08-31 MED ORDER — DOCUSATE SODIUM 100 MG PO CAPS
100.0000 mg | ORAL_CAPSULE | Freq: Two times a day (BID) | ORAL | Status: DC
  Administered 2018-08-31 – 2018-09-02 (×4): 100 mg via ORAL
  Filled 2018-08-31 (×4): qty 1

## 2018-08-31 MED ORDER — FENTANYL CITRATE (PF) 100 MCG/2ML IJ SOLN: 25.0000 ug | INTRAMUSCULAR | Status: DC | PRN

## 2018-08-31 MED ORDER — ONDANSETRON HCL 4 MG/2ML IJ SOLN
INTRAMUSCULAR | Status: AC
  Filled 2018-08-31: qty 2

## 2018-08-31 MED ORDER — PROPOFOL 10 MG/ML IV BOLUS
INTRAVENOUS | Status: DC | PRN
  Administered 2018-08-31 (×2): 20 mg via INTRAVENOUS

## 2018-08-31 SURGICAL SUPPLY — 61 items
ATTUNE MED DOME PAT 41 KNEE (Knees) ×1 IMPLANT
ATTUNE PS FEM RT SZ 5 CEM KNEE (Femur) ×1 IMPLANT
ATTUNE PSRP INSR SZ5 5 KNEE (Insert) ×1 IMPLANT
BAG DECANTER FOR FLEXI CONT (MISCELLANEOUS) ×1 IMPLANT
BANDAGE ESMARK 6X9 LF (GAUZE/BANDAGES/DRESSINGS) ×1 IMPLANT
BASE TIBIA ATTUNE KNEE SYS SZ6 (Knees) IMPLANT
BLADE SAGITTAL 25.0X1.19X90 (BLADE) ×2 IMPLANT
BLADE SAW SGTL 13.0X1.19X90.0M (BLADE) ×1 IMPLANT
BNDG CMPR 9X6 STRL LF SNTH (GAUZE/BANDAGES/DRESSINGS) ×1
BNDG CMPR MED 10X6 ELC LF (GAUZE/BANDAGES/DRESSINGS) ×1
BNDG ELASTIC 6X10 VLCR STRL LF (GAUZE/BANDAGES/DRESSINGS) ×2 IMPLANT
BNDG ESMARK 6X9 LF (GAUZE/BANDAGES/DRESSINGS) ×2
BOWL SMART MIX CTS (DISPOSABLE) ×2 IMPLANT
BSPLAT TIB 6 CMNT ROT PLAT STR (Knees) ×1 IMPLANT
CEMENT HV SMART SET (Cement) ×4 IMPLANT
CLSR STERI-STRIP ANTIMIC 1/2X4 (GAUZE/BANDAGES/DRESSINGS) IMPLANT
COVER SURGICAL LIGHT HANDLE (MISCELLANEOUS) ×2 IMPLANT
COVER WAND RF STERILE (DRAPES) ×2 IMPLANT
CUFF TOURNIQUET SINGLE 34IN LL (TOURNIQUET CUFF) ×2 IMPLANT
CUFF TOURNIQUET SINGLE 44IN (TOURNIQUET CUFF) IMPLANT
DECANTER SPIKE VIAL GLASS SM (MISCELLANEOUS) ×1 IMPLANT
DRAPE EXTREMITY T 121X128X90 (DRAPE) ×2 IMPLANT
DRAPE HALF SHEET 40X57 (DRAPES) ×3 IMPLANT
DRAPE U-SHAPE 47X51 STRL (DRAPES) ×2 IMPLANT
DRSG AQUACEL AG ADV 3.5X10 (GAUZE/BANDAGES/DRESSINGS) ×2 IMPLANT
DURAPREP 26ML APPLICATOR (WOUND CARE) ×2 IMPLANT
ELECT REM PT RETURN 9FT ADLT (ELECTROSURGICAL) ×2
ELECTRODE REM PT RTRN 9FT ADLT (ELECTROSURGICAL) ×1 IMPLANT
GLOVE BIO SURGEON STRL SZ8 (GLOVE) ×4 IMPLANT
GLOVE BIOGEL PI IND STRL 8 (GLOVE) ×2 IMPLANT
GLOVE BIOGEL PI INDICATOR 8 (GLOVE) ×2
GOWN STRL REUS W/ TWL LRG LVL3 (GOWN DISPOSABLE) ×1 IMPLANT
GOWN STRL REUS W/ TWL XL LVL3 (GOWN DISPOSABLE) ×2 IMPLANT
GOWN STRL REUS W/TWL LRG LVL3 (GOWN DISPOSABLE) ×2
GOWN STRL REUS W/TWL XL LVL3 (GOWN DISPOSABLE) ×4
HANDPIECE INTERPULSE COAX TIP (DISPOSABLE) ×2
HOOD PEEL AWAY FACE SHEILD DIS (HOOD) ×4 IMPLANT
IMMOBILIZER KNEE 22 (SOFTGOODS) IMPLANT
IMMOBILIZER KNEE 22 UNIV (SOFTGOODS) ×1 IMPLANT
KIT BASIN OR (CUSTOM PROCEDURE TRAY) ×2 IMPLANT
KIT TURNOVER KIT B (KITS) ×2 IMPLANT
MANIFOLD NEPTUNE II (INSTRUMENTS) ×2 IMPLANT
NDL HYPO 21X1 ECLIPSE (NEEDLE) ×1 IMPLANT
NEEDLE HYPO 21X1 ECLIPSE (NEEDLE) ×2 IMPLANT
NS IRRIG 1000ML POUR BTL (IV SOLUTION) ×2 IMPLANT
PACK TOTAL JOINT (CUSTOM PROCEDURE TRAY) ×2 IMPLANT
PAD ARMBOARD 7.5X6 YLW CONV (MISCELLANEOUS) ×4 IMPLANT
PIN STEINMAN FIXATION KNEE (PIN) ×1 IMPLANT
SET HNDPC FAN SPRY TIP SCT (DISPOSABLE) ×1 IMPLANT
STRIP CLOSURE SKIN 1/2X4 (GAUZE/BANDAGES/DRESSINGS) ×1 IMPLANT
SUT VIC AB 0 CT1 27 (SUTURE) ×2
SUT VIC AB 0 CT1 27XBRD ANBCTR (SUTURE) ×1 IMPLANT
SUT VIC AB 2-0 CT1 27 (SUTURE) ×2
SUT VIC AB 2-0 CT1 TAPERPNT 27 (SUTURE) ×1 IMPLANT
SUT VIC AB 3-0 FS2 27 (SUTURE) ×2 IMPLANT
SUT VLOC 180 0 24IN GS25 (SUTURE) ×2 IMPLANT
SYR 50ML LL SCALE MARK (SYRINGE) ×2 IMPLANT
TIBIA ATTUNE KNEE SYS BASE SZ6 (Knees) ×2 IMPLANT
TOWEL OR 17X24 6PK STRL BLUE (TOWEL DISPOSABLE) ×2 IMPLANT
TOWEL OR 17X26 10 PK STRL BLUE (TOWEL DISPOSABLE) ×2 IMPLANT
TRAY CATH 16FR W/PLASTIC CATH (SET/KITS/TRAYS/PACK) IMPLANT

## 2018-08-31 NOTE — Evaluation (Signed)
Physical Therapy Evaluation Patient Details Name: Natalie Hensley MRN: [id number] DOB: July 12, 1956 Today's Date: 08/31/2018   History of Present Illness  Pt is a 62 y/o female s/p elective R TKA. PMH includes CAD, DM, HTN, L TKA, and MI.   Clinical Impression  Pt is s/p surgery above with deficits below. Pt very limited secondary to pain and mobility limited to stand pivot to chair. Required min A for steadying assist using RW during transfer. Reviewed knee precautions and supine HEP. Will continue to follow acutely to maximize functional mobility independence and safety.     Follow Up Recommendations Follow surgeon's recommendation for DC plan and follow-up therapies;Supervision for mobility/OOB    Equipment Recommendations  None recommended by PT    Recommendations for Other Services       Precautions / Restrictions Precautions Precautions: Knee Precaution Booklet Issued: Yes (comment) Precaution Comments: Reviewed knee precautions and supine HEP.  Restrictions Weight Bearing Restrictions: Yes RLE Weight Bearing: Weight bearing as tolerated      Mobility  Bed Mobility Overal bed mobility: Needs Assistance Bed Mobility: Supine to Sit     Supine to sit: Min assist     General bed mobility comments: Min A for RLE management. Increased time required secondary to pain.   Transfers Overall transfer level: Needs assistance Equipment used: Rolling walker (2 wheeled) Transfers: Sit to/from [facility] Sit to Stand: Min assist Stand pivot transfers: Min assist       General transfer comment: Min A for lift assist and steadying. Pt with increased pain and very limited weightshift to RLE, therefore mobility limited to stand pivot to chair. Verbal cues for sequencing using RW.   Ambulation/Gait             General Gait Details: Deferred secondary to pain.   Stairs            Wheelchair Mobility    Modified Rankin (Stroke Patients Only)        Balance Overall balance assessment: Needs assistance                                           Pertinent Vitals/Pain Pain Assessment: 0-10 Pain Score: 8  Pain Location: R knee  Pain Descriptors / Indicators: Aching;Operative site guarding Pain Intervention(s): Limited activity within patient's tolerance;Monitored during session;Repositioned    Home Living Family/patient expects to be discharged to:: Private residence Living Arrangements: Alone Available Help at Discharge: Friend(s);Available 24 hours/day Type of Home: House Home Access: Stairs to enter Entrance Stairs-Rails: None Entrance Stairs-Number of Steps: 3(1 curb, 1 porch, and 1 step into house) Home Layout: One level Home Equipment: Environmental consultant - 2 wheels;Cane - single point;Bedside commode;Shower seat Additional Comments: Pt reports she plans to stay with friend at d/c.     Prior Function Level of Independence: Independent               Hand Dominance        Extremity/Trunk Assessment   Upper Extremity Assessment Upper Extremity Assessment: Overall WFL for tasks assessed    Lower Extremity Assessment Lower Extremity Assessment: RLE deficits/detail RLE Deficits / Details: Deficits consistent with post op pain and weakness. Limited tolerance for ther ex.     Cervical / Trunk Assessment Cervical / Trunk Assessment: Normal  Communication   Communication: No difficulties  Cognition Arousal/Alertness: Awake/alert Behavior During Therapy: WFL for tasks assessed/performed  Overall Cognitive Status: Within Functional Limits for tasks assessed                                        General Comments General comments (skin integrity, edema, etc.): Pt's friend present during session.     Exercises Total Joint Exercises Ankle Circles/Pumps: AROM;Both;20 reps Quad Sets: AROM;Right;10 reps   Assessment/Plan    PT Assessment Patient needs continued PT services  PT Problem  List Decreased strength;Decreased activity tolerance;Decreased range of motion;Decreased balance;Decreased mobility;Decreased knowledge of use of DME;Decreased knowledge of precautions;Pain       PT Treatment Interventions DME instruction;Gait training;Stair training;Therapeutic activities;Functional mobility training;Therapeutic exercise;Balance training;Patient/family education    PT Goals (Current goals can be found in the Care Plan section)  Acute Rehab PT Goals Patient Stated Goal: "for my pain to get better"  PT Goal Formulation: With patient Time For Goal Achievement: 09/14/18 Potential to Achieve Goals: Good    Frequency 7X/week   Barriers to discharge        Co-evaluation               AM-PAC PT "6 Clicks" Daily Activity  Outcome Measure Difficulty turning over in bed (including adjusting bedclothes, sheets and blankets)?: A Little Difficulty moving from lying on back to sitting on the side of the bed? : Unable Difficulty sitting down on and standing up from a chair with arms (e.g., wheelchair, bedside commode, etc,.)?: Unable Help needed moving to and from a bed to chair (including a wheelchair)?: A Little Help needed walking in hospital room?: A Lot Help needed climbing 3-5 steps with a railing? : A Lot 6 Click Score: 12    End of Session Equipment Utilized During Treatment: Gait belt Activity Tolerance: Patient limited by pain Patient left: in chair;with call bell/phone within reach;with family/visitor present Nurse Communication: Mobility status PT Visit Diagnosis: Other abnormalities of gait and mobility (R26.89);Muscle weakness (generalized) (M62.81);Pain Pain - Right/Left: Right Pain - part of body: Knee    Time: 3735-7897 PT Time Calculation (min) (ACUTE ONLY): 16 min   Charges:   PT Evaluation $PT Eval Low Complexity: [place], PT, DPT  Acute Rehabilitation Services  Pager: [phone] Office: [phone]   Rudean Hitt 08/31/2018, 3:59 PM

## 2018-08-31 NOTE — Interval H&P Note (Signed)
History and Physical Interval Note:  08/31/2018 7:15 AM  Natalie Hensley  has presented today for surgery, with the diagnosis of RIGHT KNEE DEGENERATIVE JOINT DISEASE  The various methods of treatment have been discussed with the patient and family. After consideration of risks, benefits and other options for treatment, the patient has consented to  Procedure(s): RIGHT TOTAL KNEE ARTHROPLASTY (Right) as a surgical intervention .  The patient's history has been reviewed, patient examined, no change in status, stable for surgery.  I have reviewed the patient's chart and labs.  Questions were answered to the patient's satisfaction.     Makya Yurko G

## 2018-08-31 NOTE — Anesthesia Procedure Notes (Signed)
Procedure Name: MAC Date/Time: 08/31/2018 7:30 AM Performed by: Barrington Ellison, CRNA Pre-anesthesia Checklist: Patient identified, Emergency Drugs available, Suction available, Patient being monitored and Timeout performed Patient Re-evaluated:Patient Re-evaluated prior to induction Oxygen Delivery Method: Simple face mask

## 2018-08-31 NOTE — Transfer of Care (Signed)
Immediate Anesthesia Transfer of Care Note  Patient: Natalie Hensley  Procedure(s) Performed: RIGHT TOTAL KNEE ARTHROPLASTY (Right Knee)  Patient Location: PACU  Anesthesia Type:MAC and Spinal  Level of Consciousness: awake, alert  and oriented  Airway & Oxygen Therapy: Patient Spontanous Breathing  Post-op Assessment: Report given to RN  Post vital signs: Reviewed and stable  Last Vitals:  Vitals Value Taken Time  BP    Temp    Pulse 72 08/31/2018  9:30 AM  Resp 21 08/31/2018  9:30 AM  SpO2 97 % 08/31/2018  9:30 AM  Vitals shown include unvalidated device data.  Last Pain:  Vitals:   08/31/18 0617  TempSrc: Oral  PainSc:       Patients Stated Pain Goal: 4 (71/06/26 9485)  Complications: No apparent anesthesia complications

## 2018-08-31 NOTE — Anesthesia Procedure Notes (Signed)
Spinal  Patient location during procedure: OR Start time: 08/31/2018 7:28 AM End time: 08/31/2018 7:38 AM Staffing Anesthesiologist: Duane Boston, MD Performed: anesthesiologist  Preanesthetic Checklist Completed: patient identified, surgical consent, pre-op evaluation, timeout performed, IV checked, risks and benefits discussed and monitors and equipment checked Spinal Block Patient position: sitting Prep: DuraPrep Patient monitoring: cardiac monitor, continuous pulse ox and blood pressure Approach: midline Location: L2-3 Injection technique: single-shot Needle Needle type: Pencan  Needle gauge: 24 G Needle length: 9 cm Additional Notes Functioning IV was confirmed and monitors were applied. Sterile prep and drape, including hand hygiene and sterile gloves were used. The patient was positioned and the spine was prepped. The skin was anesthetized with lidocaine.  Free flow of clear CSF was obtained prior to injecting local anesthetic into the CSF.  The spinal needle aspirated freely following injection.  The needle was carefully withdrawn.  The patient tolerated the procedure well.

## 2018-08-31 NOTE — Anesthesia Postprocedure Evaluation (Signed)
Anesthesia Post Note  Patient: Natalie Hensley  Procedure(s) Performed: RIGHT TOTAL KNEE ARTHROPLASTY (Right Knee)     Patient location during evaluation: PACU Anesthesia Type: MAC and Spinal Level of consciousness: awake and alert Pain management: pain level controlled Vital Signs Assessment: post-procedure vital signs reviewed and stable Respiratory status: spontaneous breathing and respiratory function stable Cardiovascular status: blood pressure returned to baseline and stable Postop Assessment: spinal receding Anesthetic complications: no    Last Vitals:  Vitals:   08/31/18 1030 08/31/18 1045  BP: 109/79 125/66  Pulse: 73 76  Resp: 15 19  Temp:  (!) 36.3 C  SpO2: 97% 98%    Last Pain:  Vitals:   08/31/18 1030  TempSrc:   PainSc: 0-No pain                 [name]

## 2018-08-31 NOTE — Anesthesia Procedure Notes (Signed)
Anesthesia Regional Block: Adductor canal block   Pre-Anesthetic Checklist: ,, timeout performed, Correct Patient, Correct Site, Correct Laterality, Correct Procedure, Correct Position, site marked, Risks and benefits discussed,  Surgical consent,  Pre-op evaluation,  At surgeon's request and post-op pain management  Laterality: Right  Prep: chloraprep       Needles:  Injection technique: Single-shot  Needle Type: Stimulator Needle - 80     Needle Length: 10cm  Needle Gauge: 21     Additional Needles:   Narrative:  Start time: 08/31/2018 6:54 AM End time: 08/31/2018 7:04 AM Injection made incrementally with aspirations every 5 mL.  Performed by: Personally

## 2018-08-31 NOTE — Op Note (Signed)
PREOP DIAGNOSIS: DJD RIGHT KNEE POSTOP DIAGNOSIS: same PROCEDURE: RIGHT TKR ANESTHESIA: Spinal and MAC ATTENDING SURGEON: Ezana Hubbert G ASSISTANT: Loni Dolly PA  INDICATIONS FOR PROCEDURE: Natalie Hensley is a 62 y.o. female who has struggled for a long time with pain due to degenerative arthritis of the right knee.  The patient has failed many conservative non-operative measures and at this point has pain which limits the ability to sleep and walk.  The patient is offered total knee replacement.  Informed operative consent was obtained after discussion of possible risks of anesthesia, infection, neurovascular injury, DVT, and death.  The importance of the post-operative rehabilitation protocol to optimize result was stressed extensively with the patient.  SUMMARY OF FINDINGS AND PROCEDURE:  Natalie Hensley was taken to the operative suite where under the above anesthesia a right knee replacement was performed.  There were advanced degenerative changes and the bone quality was good.  We used the DePuy Attune system and placed size 5 femur, 6 tibia, 41 mm all polyethylene patella, and a size 5 mm spacer.  Loni Dolly PA-C assisted throughout and was invaluable to the completion of the case in that he helped retract and maintain exposure while I placed components.  He also helped close thereby minimizing OR time.  The patient was admitted for appropriate post-op care to include perioperative antibiotics and mechanical and pharmacologic measures for DVT prophylaxis.  DESCRIPTION OF PROCEDURE:  Natalie Hensley was taken to the operative suite where the above anesthesia was applied.  The patient was positioned supine and prepped and draped in normal sterile fashion.  An appropriate time out was performed.  After the administration of kefzol pre-op antibiotic the leg was elevated and exsanguinated and a tourniquet inflated. A standard longitudinal incision was made on the anterior knee.  Dissection was  carried down to the extensor mechanism.  All appropriate anti-infective measures were used including the pre-operative antibiotic, betadine impregnated drape, and closed hooded exhaust systems for each member of the surgical team.  A medial parapatellar incision was made in the extensor mechanism and the knee cap flipped and the knee flexed.  Some residual meniscal tissues were removed along with any remaining ACL/PCL tissue.  A guide was placed on the tibia and a flat cut was made on it's superior surface.  An intramedullary guide was placed in the femur and was utilized to make anterior and posterior cuts creating an appropriate flexion gap.  A second intramedullary guide was placed in the femur to make a distal cut properly balancing the knee with an extension gap equal to the flexion gap.  The three bones sized to the above mentioned sizes and the appropriate guides were placed and utilized.  A trial reduction was done and the knee easily came to full extension and the patella tracked well on flexion.  The trial components were removed and all bones were cleaned with pulsatile lavage and then dried thoroughly.  Cement was mixed and was pressurized onto the bones followed by placement of the aforementioned components.  Excess cement was trimmed and pressure was held on the components until the cement had hardened.  The tourniquet was deflated and a small amount of bleeding was controlled with cautery and pressure.  The knee was irrigated thoroughly.  The extensor mechanism was re-approximated with V-loc suture in running fashion.  The knee was flexed and the repair was solid.  The subcutaneous tissues were re-approximated with #0 and #2-0 vicryl and the skin closed with a  subcuticular stitch and steristrips.  A sterile dressing was applied.  Intraoperative fluids, EBL, and tourniquet time can be obtained from anesthesia records.  DISPOSITION:  The patient was taken to recovery room in stable condition and  admitted for appropriate post-op care to include peri-operative antibiotic and DVT prophylaxis with mechanical and pharmacologic measures.  Mimi Debellis G 08/31/2018, 8:59 AM

## 2018-08-31 NOTE — Discharge Instructions (Signed)

## 2018-09-01 ENCOUNTER — Encounter (HOSPITAL_COMMUNITY): Payer: Self-pay | Admitting: General Practice

## 2018-09-01 DIAGNOSIS — Z7982 Long term (current) use of aspirin: Secondary | ICD-10-CM | POA: Diagnosis not present

## 2018-09-01 DIAGNOSIS — M1711 Unilateral primary osteoarthritis, right knee: Secondary | ICD-10-CM | POA: Diagnosis not present

## 2018-09-01 DIAGNOSIS — Z79899 Other long term (current) drug therapy: Secondary | ICD-10-CM | POA: Diagnosis not present

## 2018-09-01 DIAGNOSIS — Z85850 Personal history of malignant neoplasm of thyroid: Secondary | ICD-10-CM | POA: Diagnosis not present

## 2018-09-01 DIAGNOSIS — E039 Hypothyroidism, unspecified: Secondary | ICD-10-CM | POA: Diagnosis not present

## 2018-09-01 DIAGNOSIS — F419 Anxiety disorder, unspecified: Secondary | ICD-10-CM | POA: Diagnosis not present

## 2018-09-01 DIAGNOSIS — E785 Hyperlipidemia, unspecified: Secondary | ICD-10-CM | POA: Diagnosis not present

## 2018-09-01 DIAGNOSIS — Z86718 Personal history of other venous thrombosis and embolism: Secondary | ICD-10-CM | POA: Diagnosis not present

## 2018-09-01 DIAGNOSIS — E119 Type 2 diabetes mellitus without complications: Secondary | ICD-10-CM | POA: Diagnosis not present

## 2018-09-01 DIAGNOSIS — I252 Old myocardial infarction: Secondary | ICD-10-CM | POA: Diagnosis not present

## 2018-09-01 DIAGNOSIS — Z7901 Long term (current) use of anticoagulants: Secondary | ICD-10-CM | POA: Diagnosis not present

## 2018-09-01 DIAGNOSIS — K219 Gastro-esophageal reflux disease without esophagitis: Secondary | ICD-10-CM | POA: Diagnosis not present

## 2018-09-01 DIAGNOSIS — I2510 Atherosclerotic heart disease of native coronary artery without angina pectoris: Secondary | ICD-10-CM | POA: Diagnosis not present

## 2018-09-01 DIAGNOSIS — F329 Major depressive disorder, single episode, unspecified: Secondary | ICD-10-CM | POA: Diagnosis not present

## 2018-09-01 DIAGNOSIS — Z951 Presence of aortocoronary bypass graft: Secondary | ICD-10-CM | POA: Diagnosis not present

## 2018-09-01 DIAGNOSIS — I10 Essential (primary) hypertension: Secondary | ICD-10-CM | POA: Diagnosis not present

## 2018-09-01 MED ORDER — BISACODYL 5 MG PO TBEC: 5.0000 mg | DELAYED_RELEASE_TABLET | Freq: Every day | ORAL | 0 refills | Status: DC | PRN

## 2018-09-01 MED ORDER — DOCUSATE SODIUM 100 MG PO CAPS: 100.0000 mg | ORAL_CAPSULE | Freq: Two times a day (BID) | ORAL | 0 refills | Status: DC

## 2018-09-01 MED ORDER — HYDROCODONE-ACETAMINOPHEN 5-325 MG PO TABS: 1.0000 | ORAL_TABLET | Freq: Four times a day (QID) | ORAL | 0 refills | Status: DC | PRN

## 2018-09-01 MED ORDER — APIXABAN 2.5 MG PO TABS: 2.5000 mg | ORAL_TABLET | Freq: Two times a day (BID) | ORAL | 2 refills | Status: DC

## 2018-09-01 MED ORDER — TIZANIDINE HCL 4 MG PO TABS: 4.0000 mg | ORAL_TABLET | Freq: Four times a day (QID) | ORAL | 1 refills | Status: AC | PRN

## 2018-09-01 NOTE — Progress Notes (Signed)
Subjective: 1 Day Post-Op Procedure(s) (LRB): RIGHT TOTAL KNEE ARTHROPLASTY (Right)   Patient is doing well and is hoping to go home today if she does well with PT.  Activity level:  wbat Diet tolerance:  ok Voiding:  ok Patient reports pain as mild.    Objective: Vital signs in last 24 hours: Temp:  [97.2 F (36.2 C)-98.2 F (36.8 C)] 98.2 F (36.8 C) (10/23 0417) Pulse Rate:  [69-82] 80 (10/23 0417) Resp:  [10-20] 16 (10/23 0417) BP: (109-133)/(63-82) 125/76 (10/23 0417) SpO2:  [93 %-100 %] 95 % (10/23 0417)  Labs: No results for input(s): HGB in the last 72 hours. No results for input(s): WBC, RBC, HCT, PLT in the last 72 hours. No results for input(s): NA, K, CL, CO2, BUN, CREATININE, GLUCOSE, CALCIUM in the last 72 hours. No results for input(s): LABPT, INR in the last 72 hours.  Physical Exam:  Neurologically intact ABD soft Neurovascular intact Sensation intact distally Intact pulses distally Dorsiflexion/Plantar flexion intact Incision: dressing C/D/I and no drainage No cellulitis present Compartment soft  Assessment/Plan:  1 Day Post-Op Procedure(s) (LRB): RIGHT TOTAL KNEE ARTHROPLASTY (Right) Advance diet Up with therapy Discharge home with home health today if she does well with PT. Eliquis for DVT prevention x 3 months due to history of DVT. Follow up in office 2 weeks post op.  Patient's anticipated LOS is less than 2 midnights, meeting these requirements: - Younger than 2 - Lives within 1 hour of care - Has a competent adult at home to recover with post-op recover - NO history of  - Chronic pain requiring opiods  - Diabetes  - Coronary Artery Disease  - Heart failure  - Heart attack  - Stroke  - DVT/VTE  - Cardiac arrhythmia  - Respiratory Failure/COPD  - Renal failure  - Anemia  - Advanced Liver disease   Natalie Hensley PAUL 09/01/2018, 7:40 AM

## 2018-09-01 NOTE — Discharge Summary (Addendum)
Patient ID: Natalie Hensley MRN: [id number] DOB/AGE: 1956-06-22 62 y.o.  Admit date: 08/31/2018 Discharge date: 09/02/2018 Admission Diagnoses:  Principal Problem:   Primary osteoarthritis of right knee   Discharge Diagnoses:  Same  Past Medical History:  Diagnosis Date  . Anxiety   . Arthritis    bilateral knees, right hip  . Carpal tunnel syndrome   . Complication of anesthesia   . Coronary artery disease   . Depression   . Diabetes ([place])   . Family history of adverse reaction to anesthesia    SISTER HAS NAUSEA   . GERD (gastroesophageal reflux disease)   . HTN (hypertension)   . Hyperlipidemia   . Hypothyroidism    No thyroid  . Myocardial infarction ([place])   . PONV (postoperative nausea and vomiting)   . Thyroid cancer ([place])   . Urinary frequency     Surgeries: Procedure(s): RIGHT TOTAL KNEE ARTHROPLASTY on 08/31/2018   Consultants:   Discharged Condition: Improved  Hospital Course: Natalie Hensley is an 62 y.o. female who was admitted 08/31/2018 for operative treatment ofPrimary osteoarthritis of right knee. Patient has severe unremitting pain that affects sleep, daily activities, and work/hobbies. After pre-op clearance the patient was taken to the operating room on 08/31/2018 and underwent  Procedure(s): RIGHT TOTAL KNEE ARTHROPLASTY.    Patient was given perioperative antibiotics:  Anti-infectives (From admission, onward)   Start     Dose/Rate Route Frequency Ordered Stop   08/31/18 1400  ceFAZolin (ANCEF) IVPB 2g/100 mL premix     2 g 200 mL/hr over 30 Minutes Intravenous Every 6 hours 08/31/18 1158 08/31/18 2015   08/31/18 0600  ceFAZolin (ANCEF) IVPB 2g/100 mL premix     2 g 200 mL/hr over 30 Minutes Intravenous On call to O.R. 08/31/18 1062 08/31/18 0735       Patient was given sequential compression devices, early ambulation, and chemoprophylaxis to prevent DVT.  Patient benefited maximally from hospital stay and there were no complications.     Recent vital signs:  Patient Vitals for the past 24 hrs:  BP Temp Temp src Pulse Resp SpO2  09/01/18 0417 125/76 98.2 F (36.8 C) Oral 80 16 95 %  08/31/18 2334 116/67 97.8 F (36.6 C) Oral 73 16 93 %  08/31/18 1938 117/68 98.2 F (36.8 C) Oral 82 18 93 %  08/31/18 1437 113/63 97.9 F (36.6 C) Oral 78 18 -  08/31/18 1125 117/82 98 F (36.7 C) Oral 74 20 94 %  08/31/18 1100 120/75 - - 77 - 96 %  08/31/18 1045 125/66 (!) 97.3 F (36.3 C) - 76 19 98 %  08/31/18 1030 109/79 - - 73 15 97 %  08/31/18 1015 126/77 - - 70 16 97 %  08/31/18 1000 133/72 - - 69 15 100 %  08/31/18 0945 123/76 - - 70 10 99 %  08/31/18 0931 115/76 (!) 97.2 F (36.2 C) - 70 17 97 %     Recent laboratory studies: No results for input(s): WBC, HGB, HCT, PLT, NA, K, CL, CO2, BUN, CREATININE, GLUCOSE, INR, CALCIUM in the last 72 hours.  Invalid input(s): PT, 2   Discharge Medications:   Allergies as of 09/01/2018      Reactions   Sulfa Antibiotics Rash      Medication List    STOP taking these medications   aspirin EC 81 MG tablet     TAKE these medications   ALPRAZolam 0.25 MG tablet Commonly known as:  [name]  Take 0.125 mg by mouth daily as needed for anxiety.   apixaban 2.5 MG Tabs tablet Commonly known as:  ELIQUIS Take 1 tablet (2.5 mg total) by mouth 2 (two) times daily.   bisacodyl 5 MG EC tablet Commonly known as:  DULCOLAX Take 1 tablet (5 mg total) by mouth daily as needed for moderate constipation.   busPIRone 10 MG tablet Commonly known as:  BUSPAR Take 10 mg by mouth 2 (two) times daily.   docusate sodium 100 MG capsule Commonly known as:  COLACE Take 1 capsule (100 mg total) by mouth 2 (two) times daily.   ezetimibe 10 MG tablet Commonly known as:  ZETIA Take 1 tablet (10 mg total) by mouth daily.   HYDROcodone-acetaminophen 5-325 MG tablet Commonly known as:  NORCO/VICODIN Take 1-2 tablets by mouth every 6 (six) hours as needed for moderate pain (pain score 4-6).    levothyroxine 125 MCG tablet Commonly known as:  SYNTHROID, LEVOTHROID Take 125 mcg by mouth daily before breakfast.   metoprolol tartrate 50 MG tablet Commonly known as:  LOPRESSOR Take 12.5 mg by mouth 2 (two) times daily.   nitroGLYCERIN 0.4 MG SL tablet Commonly known as:  NITROSTAT PLACE 1 TABLET UNDER TONGUE EVERY 5 MINUTES AS NEEDED FOR CHEST PAIN   omeprazole 20 MG capsule Commonly known as:  PRILOSEC Take 20 mg by mouth at bedtime.   simvastatin 40 MG tablet Commonly known as:  ZOCOR TAKE 1 TABLET BY MOUTH EVERY DAY IN THE EVENING   tiZANidine 4 MG tablet Commonly known as:  ZANAFLEX Take 1 tablet (4 mg total) by mouth every 6 (six) hours as needed.            Durable Medical Equipment  (From admission, onward)         Start     Ordered   08/31/18 1159  DME Walker rolling  Once    Question:  Patient needs a walker to treat with the following condition  Answer:  Primary osteoarthritis of right knee   08/31/18 1158   08/31/18 1159  DME 3 n 1  Once     08/31/18 1158   08/31/18 1159  DME Bedside commode  Once    Question:  Patient needs a bedside commode to treat with the following condition  Answer:  Primary osteoarthritis of right knee   08/31/18 1158          Diagnostic Studies: No results found.  Disposition: Discharge disposition: 01-Home or Self Care       Discharge Instructions    Call MD / Call 911   Complete by:  As directed    If you experience chest pain or shortness of breath, CALL 911 and be transported to the hospital emergency room.  If you develope a fever above 101 F, pus (white drainage) or increased drainage or redness at the wound, or calf pain, call your surgeon's office.   Constipation Prevention   Complete by:  As directed    Drink plenty of fluids.  Prune juice may be helpful.  You may use a stool softener, such as Colace (over the counter) 100 mg twice a day.  Use MiraLax (over the counter) for constipation as needed.    Diet - low sodium heart healthy   Complete by:  As directed    Discharge instructions   Complete by:  As directed    INSTRUCTIONS AFTER JOINT REPLACEMENT   Remove items at home which could result in a fall.  This includes throw rugs or furniture in walking pathways ICE to the affected joint every three hours while awake for 30 minutes at a time, for at least the first 3-5 days, and then as needed for pain and swelling.  Continue to use ice for pain and swelling. You may notice swelling that will progress down to the foot and ankle.  This is normal after surgery.  Elevate your leg when you are not up walking on it.   Continue to use the breathing machine you got in the hospital (incentive spirometer) which will help keep your temperature down.  It is common for your temperature to cycle up and down following surgery, especially at night when you are not up moving around and exerting yourself.  The breathing machine keeps your lungs expanded and your temperature down.   DIET:  As you were doing prior to hospitalization, we recommend a well-balanced diet.  DRESSING / WOUND CARE / SHOWERING  You may change your dressing 3-5 days after surgery.  Then change the dressing every day with sterile gauze.  Please use good hand washing techniques before changing the dressing.  Do not use any lotions or creams on the incision until instructed by your surgeon.  ACTIVITY  Increase activity slowly as tolerated, but follow the weight bearing instructions below.   No driving for 6 weeks or until further direction given by your physician.  You cannot drive while taking narcotics.  No lifting or carrying greater than 10 lbs. until further directed by your surgeon. Avoid periods of inactivity such as sitting longer than an hour when not asleep. This helps prevent blood clots.  You may return to work once you are authorized by your doctor.     WEIGHT BEARING   Weight bearing as tolerated with assist device  (walker, cane, etc) as directed, use it as long as suggested by your surgeon or therapist, typically at least 4-6 weeks.   EXERCISES  Results after joint replacement surgery are often greatly improved when you follow the exercise, range of motion and muscle strengthening exercises prescribed by your doctor. Safety measures are also important to protect the joint from further injury. Any time any of these exercises cause you to have increased pain or swelling, decrease what you are doing until you are comfortable again and then slowly increase them. If you have problems or questions, call your caregiver or physical therapist for advice.   Rehabilitation is important following a joint replacement. After just a few days of immobilization, the muscles of the leg can become weakened and shrink (atrophy).  These exercises are designed to build up the tone and strength of the thigh and leg muscles and to improve motion. Often times heat used for twenty to thirty minutes before working out will loosen up your tissues and help with improving the range of motion but do not use heat for the first two weeks following surgery (sometimes heat can increase post-operative swelling).   These exercises can be done on a training (exercise) mat, on the floor, on a table or on a bed. Use whatever works the best and is most comfortable for you.    Use music or television while you are exercising so that the exercises are a pleasant break in your day. This will make your life better with the exercises acting as a break in your routine that you can look forward to.   Perform all exercises about fifteen times, three times per day or as directed.  You should exercise both the operative leg and the other leg as well.   Exercises include:   Quad Sets - Tighten up the muscle on the front of the thigh (Quad) and hold for 5-10 seconds.   Straight Leg Raises - With your knee straight (if you were given a brace, keep it on), lift the  leg to 60 degrees, hold for 3 seconds, and slowly lower the leg.  Perform this exercise against resistance later as your leg gets stronger.  Leg Slides: Lying on your back, slowly slide your foot toward your buttocks, bending your knee up off the floor (only go as far as is comfortable). Then slowly slide your foot back down until your leg is flat on the floor again.  Angel Wings: Lying on your back spread your legs to the side as far apart as you can without causing discomfort.  Hamstring Strength:  Lying on your back, push your heel against the floor with your leg straight by tightening up the muscles of your buttocks.  Repeat, but this time bend your knee to a comfortable angle, and push your heel against the floor.  You may put a pillow under the heel to make it more comfortable if necessary.   A rehabilitation program following joint replacement surgery can speed recovery and prevent re-injury in the future due to weakened muscles. Contact your doctor or a physical therapist for more information on knee rehabilitation.    CONSTIPATION  Constipation is defined medically as fewer than three stools per week and severe constipation as less than one stool per week.  Even if you have a regular bowel pattern at home, your normal regimen is likely to be disrupted due to multiple reasons following surgery.  Combination of anesthesia, postoperative narcotics, change in appetite and fluid intake all can affect your bowels.   YOU MUST use at least one of the following options; they are listed in order of increasing strength to get the job done.  They are all available over the counter, and you may need to use some, POSSIBLY even all of these options:    Drink plenty of fluids (prune juice may be helpful) and high fiber foods Colace 100 mg by mouth twice a day  Senokot for constipation as directed and as needed Dulcolax (bisacodyl), take with full glass of water  Miralax (polyethylene glycol) once or twice  a day as needed.  If you have tried all these things and are unable to have a bowel movement in the first 3-4 days after surgery call either your surgeon or your primary doctor.    If you experience loose stools or diarrhea, hold the medications until you stool forms back up.  If your symptoms do not get better within 1 week or if they get worse, check with your doctor.  If you experience "the worst abdominal pain ever" or develop nausea or vomiting, please contact the office immediately for further recommendations for treatment.   ITCHING:  If you experience itching with your medications, try taking only a single pain pill, or even half a pain pill at a time.  You can also use Benadryl over the counter for itching or also to help with sleep.   TED HOSE STOCKINGS:  Use stockings on both legs until for at least 2 weeks or as directed by physician office. They may be removed at night for sleeping.  MEDICATIONS:  See your medication summary on the "After Visit Summary" that nursing will review with  you.  You may have some home medications which will be placed on hold until you complete the course of blood thinner medication.  It is important for you to complete the blood thinner medication as prescribed.  PRECAUTIONS:  If you experience chest pain or shortness of breath - call 911 immediately for transfer to the hospital emergency department.   If you develop a fever greater that 101 F, purulent drainage from wound, increased redness or drainage from wound, foul odor from the wound/dressing, or calf pain - CONTACT YOUR SURGEON.                                                   FOLLOW-UP APPOINTMENTS:  If you do not already have a post-op appointment, please call the office for an appointment to be seen by your surgeon.  Guidelines for how soon to be seen are listed in your "After Visit Summary", but are typically between 1-4 weeks after surgery.  OTHER INSTRUCTIONS:   Knee Replacement:  Do not  place pillow under knee, focus on keeping the knee straight while resting. CPM instructions: 0-90 degrees, 2 hours in the morning, 2 hours in the afternoon, and 2 hours in the evening. Place foam block, curve side up under heel at all times except when in CPM or when walking.  DO NOT modify, tear, cut, or change the foam block in any way.  MAKE SURE YOU:  Understand these instructions.  Get help right away if you are not doing well or get worse.    Thank you for letting us be a part of your medical care team.  It is a privilege we respect greatly.  We hope these instructions will help you stay on track for a fast and full recovery!   Increase activity slowly as tolerated   Complete by:  As directed       Follow-up Information    Melrose Nakayama, MD. Schedule an appointment as soon as possible for a visit in 2 weeks.   Specialty:  Orthopedic Surgery Contact information: [place] [address] [phone]            Signed: Rich Fuchs 09/01/2018, 7:44 AM

## 2018-09-01 NOTE — Progress Notes (Signed)
Physical Therapy Treatment Patient Details Name: Natalie Hensley MRN: [id number] DOB: 1955-12-21 Today's Date: 09/01/2018    History of Present Illness Pt is a 62 y/o female s/p elective R TKA. PMH includes CAD, DM, HTN, L TKA, and MI.     PT Comments    Patient seen for mobility progression. Pt is making progress toward PT goals and able to ambulate 160 ft with RW and min guard for safety. Continue to progress as tolerated and plan for stair training this pm session.    Follow Up Recommendations  Follow surgeon's recommendation for DC plan and follow-up therapies;Supervision for mobility/OOB     Equipment Recommendations  None recommended by PT    Recommendations for Other Services       Precautions / Restrictions Precautions Precautions: Knee Precaution Comments: Reviewed knee precautions and positioning   Restrictions Weight Bearing Restrictions: Yes RLE Weight Bearing: Weight bearing as tolerated    Mobility  Bed Mobility Overal bed mobility: Modified Independent Bed Mobility: Supine to Sit           General bed mobility comments: increased time and effort; no use of rails   Transfers Overall transfer level: Needs assistance Equipment used: Rolling walker (2 wheeled) Transfers: Sit to/from Stand Sit to Stand: Min guard         General transfer comment: min guard for safety; cues for safe hand placement   Ambulation/Gait Ambulation/Gait assistance: Min guard Gait Distance (Feet): 160 Feet Assistive device: Rolling walker (2 wheeled) Gait Pattern/deviations: Step-through pattern;Decreased stance time - right;Decreased step length - left;Decreased step length - right;Decreased weight shift to right Gait velocity: decreased   General Gait Details: cues for sequencing, posture/forward gaze, and step length symmetry   Stairs             Wheelchair Mobility    Modified Rankin (Stroke Patients Only)       Balance Overall balance assessment:  Needs assistance   Sitting balance-Leahy Scale: Good     Standing balance support: Bilateral upper extremity supported Standing balance-Leahy Scale: Poor                              Cognition Arousal/Alertness: Awake/alert Behavior During Therapy: WFL for tasks assessed/performed Overall Cognitive Status: Within Functional Limits for tasks assessed                                        Exercises      General Comments        Pertinent Vitals/Pain Pain Assessment: 0-10 Pain Score: 6  Pain Location: R knee  Pain Descriptors / Indicators: Aching;Operative site guarding Pain Intervention(s): Limited activity within patient's tolerance;Monitored during session;Premedicated before session;Repositioned;Ice applied    Home Living                      Prior Function            PT Goals (current goals can now be found in the care plan section) Acute Rehab PT Goals PT Goal Formulation: With patient Time For Goal Achievement: 09/14/18 Potential to Achieve Goals: Good Progress towards PT goals: Progressing toward goals    Frequency    7X/week      PT Plan Current plan remains appropriate    Co-evaluation  AM-PAC PT "6 Clicks" Daily Activity  Outcome Measure  Difficulty turning over in bed (including adjusting bedclothes, sheets and blankets)?: A Little Difficulty moving from lying on back to sitting on the side of the bed? : A Lot Difficulty sitting down on and standing up from a chair with arms (e.g., wheelchair, bedside commode, etc,.)?: Unable Help needed moving to and from a bed to chair (including a wheelchair)?: A Little Help needed walking in hospital room?: A Little Help needed climbing 3-5 steps with a railing? : A Lot 6 Click Score: 14    End of Session Equipment Utilized During Treatment: Gait belt Activity Tolerance: Patient tolerated treatment well Patient left: in chair;with call bell/phone  within reach Nurse Communication: Mobility status PT Visit Diagnosis: Other abnormalities of gait and mobility (R26.89);Muscle weakness (generalized) (M62.81);Pain Pain - Right/Left: Right Pain - part of body: Knee     Time: 1735-6701 PT Time Calculation (min) (ACUTE ONLY): 24 min  Charges:  $Gait Training: 23-37 mins                     Earney Navy, PTA Acute Rehabilitation Services Pager: [phone] Office: [phone]     Darliss Cheney 09/01/2018, 9:39 AM

## 2018-09-01 NOTE — Progress Notes (Signed)
Physical Therapy Treatment Patient Details Name: Natalie Hensley MRN: [id number] DOB: 1956-10-09 Today's Date: 09/01/2018    History of Present Illness Pt is a 62 y/o female s/p elective R TKA. PMH includes CAD, DM, HTN, L TKA, and MI.     PT Comments    Patient seen for mobility progression. Pt presents with antalgic gait pattern and limited activity tolerance compared to previous session. Pt is able to ascend/descend single step with RW and min A and friend present for education. Pt will likely continue to progress very well when pain is more controlled. Feel pt will benefit from another night stay to work on pain management, gait training, and therex prior to d/c home with assist of friend. Continue to progress as tolerated.    Follow Up Recommendations  Follow surgeon's recommendation for DC plan and follow-up therapies;Supervision for mobility/OOB     Equipment Recommendations  None recommended by PT    Recommendations for Other Services       Precautions / Restrictions Precautions Precautions: Knee Precaution Comments: Reviewed knee precautions and positioning   Restrictions Weight Bearing Restrictions: Yes RLE Weight Bearing: Weight bearing as tolerated    Mobility  Bed Mobility               General bed mobility comments: pt OOB in chair upon arrival  Transfers Overall transfer level: Needs assistance Equipment used: Rolling walker (2 wheeled) Transfers: Sit to/from Stand Sit to Stand: Min guard         General transfer comment: min guard for safety; cues for safe hand placement   Ambulation/Gait Ambulation/Gait assistance: Min guard Gait Distance (Feet): (~120ft total with seated rest break) Assistive device: Rolling walker (2 wheeled) Gait Pattern/deviations: Step-through pattern;Decreased stance time - right;Decreased step length - left;Decreased step length - right;Decreased weight shift to right;Antalgic Gait velocity: decreased   General Gait  Details: cues for posture, sequencing, and increased bilat step lengths; antalgic gait pattern    Stairs Stairs: Yes Stairs assistance: Min assist Stair Management: No rails;Step to pattern;Backwards;With walker Number of Stairs: (single steps X2 trials) General stair comments: cues for sequencing and technique; assist to stabilize RW    Wheelchair Mobility    Modified Rankin (Stroke Patients Only)       Balance Overall balance assessment: Needs assistance   Sitting balance-Leahy Scale: Good     Standing balance support: Bilateral upper extremity supported Standing balance-Leahy Scale: Poor                              Cognition Arousal/Alertness: Awake/alert Behavior During Therapy: WFL for tasks assessed/performed Overall Cognitive Status: Within Functional Limits for tasks assessed                                        Exercises Total Joint Exercises Short Arc Quad: AROM;Right;5 reps Heel Slides: AAROM;Right;5 reps Straight Leg Raises: AROM;Right;5 reps    General Comments        Pertinent Vitals/Pain Pain Assessment: 0-10 Pain Score: 8  Pain Location: R knee  Pain Descriptors / Indicators: Grimacing;Guarding;Sore;Aching Pain Intervention(s): Limited activity within patient's tolerance;Monitored during session;Repositioned;Ice applied;Other (comment)(pt requested to get pain medication after therapy session )    Home Living                      Prior  Function            PT Goals (current goals can now be found in the care plan section) Acute Rehab PT Goals PT Goal Formulation: With patient Time For Goal Achievement: 09/14/18 Potential to Achieve Goals: Good Progress towards PT goals: Progressing toward goals    Frequency    7X/week      PT Plan Current plan remains appropriate    Co-evaluation              AM-PAC PT "6 Clicks" Daily Activity  Outcome Measure  Difficulty turning over in bed  (including adjusting bedclothes, sheets and blankets)?: A Little Difficulty moving from lying on back to sitting on the side of the bed? : A Lot Difficulty sitting down on and standing up from a chair with arms (e.g., wheelchair, bedside commode, etc,.)?: Unable Help needed moving to and from a bed to chair (including a wheelchair)?: A Little Help needed walking in hospital room?: A Little Help needed climbing 3-5 steps with a railing? : A Little 6 Click Score: 15    End of Session Equipment Utilized During Treatment: Gait belt Activity Tolerance: Patient limited by pain Patient left: in chair;with call bell/phone within reach;with family/visitor present Nurse Communication: Mobility status PT Visit Diagnosis: Other abnormalities of gait and mobility (R26.89);Muscle weakness (generalized) (M62.81);Pain Pain - Right/Left: Right Pain - part of body: Knee     Time: 1343-1420 PT Time Calculation (min) (ACUTE ONLY): 37 min  Charges:  $Gait Training: 8-22 mins $Therapeutic Exercise: 8-22 mins                     Earney Navy, PTA Acute Rehabilitation Services Pager: [phone] Office: [phone]     Darliss Cheney 09/01/2018, 3:42 PM

## 2018-09-01 NOTE — Plan of Care (Signed)

## 2018-09-02 DIAGNOSIS — I10 Essential (primary) hypertension: Secondary | ICD-10-CM | POA: Diagnosis not present

## 2018-09-02 DIAGNOSIS — Z951 Presence of aortocoronary bypass graft: Secondary | ICD-10-CM | POA: Diagnosis not present

## 2018-09-02 DIAGNOSIS — K219 Gastro-esophageal reflux disease without esophagitis: Secondary | ICD-10-CM | POA: Diagnosis not present

## 2018-09-02 DIAGNOSIS — E039 Hypothyroidism, unspecified: Secondary | ICD-10-CM | POA: Diagnosis not present

## 2018-09-02 DIAGNOSIS — E785 Hyperlipidemia, unspecified: Secondary | ICD-10-CM | POA: Diagnosis not present

## 2018-09-02 DIAGNOSIS — F329 Major depressive disorder, single episode, unspecified: Secondary | ICD-10-CM | POA: Diagnosis not present

## 2018-09-02 DIAGNOSIS — M1711 Unilateral primary osteoarthritis, right knee: Secondary | ICD-10-CM | POA: Diagnosis not present

## 2018-09-02 DIAGNOSIS — I2510 Atherosclerotic heart disease of native coronary artery without angina pectoris: Secondary | ICD-10-CM | POA: Diagnosis not present

## 2018-09-02 DIAGNOSIS — Z79899 Other long term (current) drug therapy: Secondary | ICD-10-CM | POA: Diagnosis not present

## 2018-09-02 DIAGNOSIS — Z85850 Personal history of malignant neoplasm of thyroid: Secondary | ICD-10-CM | POA: Diagnosis not present

## 2018-09-02 DIAGNOSIS — F419 Anxiety disorder, unspecified: Secondary | ICD-10-CM | POA: Diagnosis not present

## 2018-09-02 DIAGNOSIS — Z7982 Long term (current) use of aspirin: Secondary | ICD-10-CM | POA: Diagnosis not present

## 2018-09-02 DIAGNOSIS — I252 Old myocardial infarction: Secondary | ICD-10-CM | POA: Diagnosis not present

## 2018-09-02 DIAGNOSIS — E119 Type 2 diabetes mellitus without complications: Secondary | ICD-10-CM | POA: Diagnosis not present

## 2018-09-02 DIAGNOSIS — Z86718 Personal history of other venous thrombosis and embolism: Secondary | ICD-10-CM | POA: Diagnosis not present

## 2018-09-02 DIAGNOSIS — Z7901 Long term (current) use of anticoagulants: Secondary | ICD-10-CM | POA: Diagnosis not present

## 2018-09-02 MED ORDER — SODIUM CHLORIDE 0.9 % IV BOLUS
500.0000 mL | Freq: Once | INTRAVENOUS | Status: AC
  Administered 2018-09-02: 500 mL via INTRAVENOUS

## 2018-09-02 NOTE — Progress Notes (Signed)
Physical Therapy Treatment Patient Details Name: Natalie Hensley MRN: [id number] DOB: 06/20/1956 Today's Date: 09/02/2018    History of Present Illness Pt is a 62 y/o female s/p elective R TKA. PMH includes CAD, DM, HTN, L TKA, and MI.     PT Comments    Patient progressing well this afternoon. Improved ambulation distance with Min guard for safety. No reports of dizziness this session. Pt getting IV fluids. Discussed safe entry from car port to house and possibly putting chair half way if she needs to sit. Also discussed car transport and positioning. Pt eager to return home. Plans to d/c this PM. Will follow if still in the hospital.    Follow Up Recommendations  Follow surgeon's recommendation for DC plan and follow-up therapies;Supervision for mobility/OOB     Equipment Recommendations  None recommended by PT    Recommendations for Other Services       Precautions / Restrictions Precautions Precautions: Knee Precaution Booklet Issued: Yes (comment) Precaution Comments: Reviewed knee precautions and positioning   Restrictions Weight Bearing Restrictions: Yes RLE Weight Bearing: Weight bearing as tolerated    Mobility  Bed Mobility Overal bed mobility: Needs Assistance Bed Mobility: Supine to Sit;Sit to Supine     Supine to sit: Modified independent (Device/Increase time);HOB elevated Sit to supine: Modified independent (Device/Increase time);HOB elevated   General bed mobility comments: Using UEs to bring RLE to EOB, able to hook LLE under RLE to bring into bed.   Transfers Overall transfer level: Needs assistance Equipment used: Rolling walker (2 wheeled) Transfers: Sit to/from Stand Sit to Stand: Min guard         General transfer comment: min guard for safety; cues for safe hand placement   Ambulation/Gait Ambulation/Gait assistance: Min guard Gait Distance (Feet): 125 Feet Assistive device: Rolling walker (2 wheeled) Gait Pattern/deviations:  Step-through pattern;Decreased stance time - right;Decreased step length - left;Decreased weight shift to right;Antalgic Gait velocity: decreased   General Gait Details: slow, mostly steady gait with increased WB through [place]. Cues for more normalized gait pattern.   Stairs             Wheelchair Mobility    Modified Rankin (Stroke Patients Only)       Balance Overall balance assessment: Needs assistance Sitting-balance support: Feet supported;No upper extremity supported Sitting balance-Leahy Scale: Good     Standing balance support: During functional activity;Bilateral upper extremity supported Standing balance-Leahy Scale: Poor Standing balance comment: Requires BUE support in standing.                             Cognition Arousal/Alertness: Awake/alert Behavior During Therapy: WFL for tasks assessed/performed Overall Cognitive Status: Within Functional Limits for tasks assessed                                        Exercises      General Comments General comments (skin integrity, edema, etc.): No dizziness present during session.      Pertinent Vitals/Pain Pain Assessment: 0-10 Pain Score: 3  Pain Location: R knee  Pain Descriptors / Indicators: Grimacing;Guarding;Sore;Aching Pain Intervention(s): Monitored during session;Repositioned;Premedicated before session;Ice applied    Home Living                      Prior Function  PT Goals (current goals can now be found in the care plan section) Progress towards PT goals: Progressing toward goals    Frequency    7X/week      PT Plan Current plan remains appropriate    Co-evaluation              AM-PAC PT "6 Clicks" Daily Activity  Outcome Measure  Difficulty turning over in bed (including adjusting bedclothes, sheets and blankets)?: A Little Difficulty moving from lying on back to sitting on the side of the bed? : None Difficulty  sitting down on and standing up from a chair with arms (e.g., wheelchair, bedside commode, etc,.)?: A Little Help needed moving to and from a bed to chair (including a wheelchair)?: A Little Help needed walking in hospital room?: A Little Help needed climbing 3-5 steps with a railing? : A Little 6 Click Score: 19    End of Session Equipment Utilized During Treatment: Gait belt Activity Tolerance: Patient tolerated treatment well Patient left: in bed;with call bell/phone within reach Nurse Communication: Mobility status PT Visit Diagnosis: Other abnormalities of gait and mobility (R26.89);Muscle weakness (generalized) (M62.81);Pain Pain - Right/Left: Right Pain - part of body: Knee     Time: 7616-0737 PT Time Calculation (min) (ACUTE ONLY): 14 min  Charges:  $Gait Training: 8-22 mins                     Wray Kearns, PT, DPT Acute Rehabilitation Services Pager [phone] Office [place] 09/02/2018, 3:24 PM

## 2018-09-02 NOTE — Care Management Note (Signed)
Case Management Note  Patient Details  Name: Natalie Hensley MRN: [id number] Date of Birth: [birth year]  Subjective/Objective:  62 yr old female admitted for right total knee arthroplasty.                 Action/Plan: Case manager spoke with patient concerning discharge plan and DME. Choice for Home Health agency was offered,Patient says she used [facility] with previous knee surgery and wants to use them now. Referral called to Neoma Laming, [facility] Liaison. Patient will have family support at discharge.     Expected Discharge Date:  09/02/18               Expected Discharge Plan:  [place]  In-House Referral:     Discharge planning Services  CM Consult  Post Acute Care Choice:  Home Health Choice offered to:  Patient  DME Arranged:  (has RW and 3in1) DME Agency:  NA  HH Arranged:  PT [place] Agency:  [facility]  Status of Service:  Completed, signed off  If discussed at [place] of Stay Meetings, dates discussed:    Additional Comments:  Ninfa Meeker, RN 09/02/2018, 2:36 PM

## 2018-09-02 NOTE — Progress Notes (Signed)
Physical Therapy Treatment Patient Details Name: Natalie Hensley MRN: [id number] DOB: Aug 30, 1956 Today's Date: 09/02/2018    History of Present Illness Pt is a 62 y/o female s/p elective R TKA. PMH includes CAD, DM, HTN, L TKA, and MI.     PT Comments    Patient seen for mobility progression this am. Pt overall requires min guard/supervision for mobility. Pt limited by nausea and dizziness when working on gait training. RN notified. Continue to progress as tolerated.    Follow Up Recommendations  Follow surgeon's recommendation for DC plan and follow-up therapies;Supervision for mobility/OOB     Equipment Recommendations  None recommended by PT    Recommendations for Other Services       Precautions / Restrictions Precautions Precautions: Knee Precaution Booklet Issued: Yes (comment) Precaution Comments: Reviewed knee precautions and positioning   Restrictions Weight Bearing Restrictions: Yes RLE Weight Bearing: Weight bearing as tolerated    Mobility  Bed Mobility Overal bed mobility: Needs Assistance Bed Mobility: Supine to Sit;Sit to Supine     Supine to sit: Modified independent (Device/Increase time);HOB elevated Sit to supine: Modified independent (Device/Increase time);HOB elevated   General bed mobility comments: pt OOB in chair upon arrival  Transfers Overall transfer level: Needs assistance Equipment used: Rolling walker (2 wheeled) Transfers: Sit to/from Stand Sit to Stand: Min guard         General transfer comment: min guard for safety; cues for safe hand placement   Ambulation/Gait Ambulation/Gait assistance: Min guard Gait Distance (Feet): (30-16ft trials X3 with seated breaks) Assistive device: Rolling walker (2 wheeled) Gait Pattern/deviations: Step-through pattern;Decreased stance time - right;Decreased step length - left;Decreased step length - right;Decreased weight shift to right;Antalgic Gait velocity: decreased   General Gait Details:  cues for increased bilat step lengths and decreased UE reliance on RW; seated rest breaks required due to c/o nause and dizziness    Stairs             Wheelchair Mobility    Modified Rankin (Stroke Patients Only)       Balance Overall balance assessment: Needs assistance Sitting-balance support: Feet supported;No upper extremity supported Sitting balance-Leahy Scale: Good     Standing balance support: Bilateral upper extremity supported Standing balance-Leahy Scale: Poor Standing balance comment: Requires BUE support in standing.                             Cognition Arousal/Alertness: Awake/alert Behavior During Therapy: WFL for tasks assessed/performed Overall Cognitive Status: Within Functional Limits for tasks assessed                                        Exercises      General Comments General comments (skin integrity, edema, etc.): pt reports "i feel like i'm going to pass out" when attempting to increase gait distance and required seated rest breaks; RN notified of request for nausea medication      Pertinent Vitals/Pain Pain Assessment: Faces Pain Score: 3  Faces Pain Scale: Hurts little more Pain Location: R knee  Pain Descriptors / Indicators: Guarding;Sore Pain Intervention(s): Monitored during session;Premedicated before session;Repositioned    Home Living                      Prior Function            PT  Goals (current goals can now be found in the care plan section) Progress towards PT goals: Progressing toward goals    Frequency    7X/week      PT Plan Current plan remains appropriate    Co-evaluation              AM-PAC PT "6 Clicks" Daily Activity  Outcome Measure  Difficulty turning over in bed (including adjusting bedclothes, sheets and blankets)?: A Little Difficulty moving from lying on back to sitting on the side of the bed? : A Lot Difficulty sitting down on and standing up  from a chair with arms (e.g., wheelchair, bedside commode, etc,.)?: Unable Help needed moving to and from a bed to chair (including a wheelchair)?: A Little Help needed walking in hospital room?: A Little Help needed climbing 3-5 steps with a railing? : A Little 6 Click Score: 15    End of Session Equipment Utilized During Treatment: Gait belt Activity Tolerance: Other (comment)(limited by nause and dizziness) Patient left: in chair;with call bell/phone within reach Nurse Communication: Mobility status PT Visit Diagnosis: Other abnormalities of gait and mobility (R26.89);Muscle weakness (generalized) (M62.81);Pain Pain - Right/Left: Right Pain - part of body: Knee     Time: 6812-7517 PT Time Calculation (min) (ACUTE ONLY): 30 min  Charges:  $Gait Training: 8-22 mins $Therapeutic Activity: 8-22 mins                     Earney Navy, PTA Acute Rehabilitation Services Pager: [phone] Office: [phone]     Darliss Cheney 09/02/2018, 3:55 PM

## 2018-09-02 NOTE — Progress Notes (Signed)
Subjective: 2 Days Post-Op Procedure(s) (LRB): RIGHT TOTAL KNEE ARTHROPLASTY (Right)  Patient had a bad episode of pain last night so she ended up staying overnight. She would like to go home today. She did feel a little nauseated and light headed with PT this morning but feels a little better now.  Activity level:  wbat Diet tolerance:  ok Voiding:  ok Patient reports pain as mild.    Objective: Vital signs in last 24 hours: Temp:  [98.4 F (36.9 C)-100 F (37.8 C)] 98.4 F (36.9 C) (10/24 0400) Pulse Rate:  [76-84] 82 (10/24 0400) Resp:  [16-18] 16 (10/24 0400) BP: (136-147)/(71-82) 136/72 (10/24 0400) SpO2:  [93 %-96 %] 93 % (10/24 0400)  Labs: No results for input(s): HGB in the last 72 hours. No results for input(s): WBC, RBC, HCT, PLT in the last 72 hours. No results for input(s): NA, K, CL, CO2, BUN, CREATININE, GLUCOSE, CALCIUM in the last 72 hours. No results for input(s): LABPT, INR in the last 72 hours.  Physical Exam:  Neurologically intact ABD soft Neurovascular intact Sensation intact distally Intact pulses distally Dorsiflexion/Plantar flexion intact Incision: dressing C/D/I and no drainage No cellulitis present Compartment soft  Assessment/Plan:  2 Days Post-Op Procedure(s) (LRB): RIGHT TOTAL KNEE ARTHROPLASTY (Right) Advance diet Up with therapy Discharge home with home health today after PT. We will give a 1L saline bolus to help make her feel better. Continue on Eliquis for DVT prevention. Follow up in office 2 weeks post op. Anticipated LOS equal to or greater than 2 midnights due to - Age 40 and older with one or more of the following:  - Obesity  - Expected need for hospital services (PT, OT, Nursing) required for safe  discharge  - Anticipated need for postoperative skilled nursing care or inpatient rehab  - Active co-morbidities: None OR   - Unanticipated findings during/Post Surgery: Slow post-op progression: GI, pain control, mobility   - Patient is a high risk of re-admission due to: None  Natalie Hensley, Larwance Sachs 09/02/2018, 1:48 PM

## 2018-09-02 NOTE — Plan of Care (Signed)

## 2018-09-02 NOTE — Plan of Care (Signed)
  Problem: Education: Goal: Knowledge of General Education information will improve Description Including pain rating scale, medication(s)/side effects and non-pharmacologic comfort measures 09/02/2018 1542 by Rance Muir, RN Outcome: Adequate for Discharge 09/02/2018 1219 by Rance Muir, RN Outcome: Progressing   Problem: Health Behavior/Discharge Planning: Goal: Ability to manage health-related needs will improve 09/02/2018 1542 by Rance Muir, RN Outcome: Adequate for Discharge 09/02/2018 1219 by Rance Muir, RN Outcome: Progressing   Problem: Clinical Measurements: Goal: Ability to maintain clinical measurements within normal limits will improve 09/02/2018 1542 by Rance Muir, RN Outcome: Adequate for Discharge 09/02/2018 1219 by Rance Muir, RN Outcome: Progressing Goal: Will remain free from infection 09/02/2018 1542 by Rance Muir, RN Outcome: Adequate for Discharge 09/02/2018 1219 by Rance Muir, RN Outcome: Progressing Goal: Diagnostic test results will improve 09/02/2018 1542 by Rance Muir, RN Outcome: Adequate for Discharge 09/02/2018 1219 by Rance Muir, RN Outcome: Progressing Goal: Respiratory complications will improve 09/02/2018 1542 by Rance Muir, RN Outcome: Adequate for Discharge 09/02/2018 1219 by Rance Muir, RN Outcome: Progressing Goal: Cardiovascular complication will be avoided 09/02/2018 1542 by Rance Muir, RN Outcome: Adequate for Discharge 09/02/2018 1219 by Rance Muir, RN Outcome: Progressing   Problem: Nutrition: Goal: Adequate nutrition will be maintained 09/02/2018 1542 by Rance Muir, RN Outcome: Adequate for Discharge 09/02/2018 1219 by Rance Muir, RN Outcome: Progressing   Problem: Activity: Goal: Risk for activity intolerance will decrease 09/02/2018 1542 by Rance Muir, RN Outcome: Adequate for Discharge 09/02/2018 1219 by Rance Muir, RN Outcome: Progressing   Problem: Coping: Goal: Level of anxiety will decrease 09/02/2018 1542 by Rance Muir,  RN Outcome: Adequate for Discharge 09/02/2018 1219 by Rance Muir, RN Outcome: Progressing   Problem: Elimination: Goal: Will not experience complications related to bowel motility 09/02/2018 1542 by Rance Muir, RN Outcome: Adequate for Discharge 09/02/2018 1219 by Rance Muir, RN Outcome: Progressing Goal: Will not experience complications related to urinary retention 09/02/2018 1542 by Rance Muir, RN Outcome: Adequate for Discharge 09/02/2018 1219 by Rance Muir, RN Outcome: Progressing   Problem: Pain Managment: Goal: General experience of comfort will improve 09/02/2018 1542 by Rance Muir, RN Outcome: Adequate for Discharge 09/02/2018 1219 by Rance Muir, RN Outcome: Progressing   Problem: Safety: Goal: Ability to remain free from injury will improve 09/02/2018 1542 by Rance Muir, RN Outcome: Adequate for Discharge 09/02/2018 1219 by Rance Muir, RN Outcome: Progressing   Problem: Skin Integrity: Goal: Risk for impaired skin integrity will decrease 09/02/2018 1542 by Rance Muir, RN Outcome: Adequate for Discharge 09/02/2018 1219 by Rance Muir, RN Outcome: Progressing

## 2018-09-03 DIAGNOSIS — M1711 Unilateral primary osteoarthritis, right knee: Secondary | ICD-10-CM | POA: Diagnosis not present

## 2018-09-04 DIAGNOSIS — Z471 Aftercare following joint replacement surgery: Secondary | ICD-10-CM | POA: Diagnosis not present

## 2018-09-04 DIAGNOSIS — I252 Old myocardial infarction: Secondary | ICD-10-CM | POA: Diagnosis not present

## 2018-09-04 DIAGNOSIS — R079 Chest pain, unspecified: Secondary | ICD-10-CM | POA: Diagnosis not present

## 2018-09-04 DIAGNOSIS — F329 Major depressive disorder, single episode, unspecified: Secondary | ICD-10-CM | POA: Diagnosis not present

## 2018-09-04 DIAGNOSIS — I2510 Atherosclerotic heart disease of native coronary artery without angina pectoris: Secondary | ICD-10-CM | POA: Diagnosis not present

## 2018-09-04 DIAGNOSIS — M1611 Unilateral primary osteoarthritis, right hip: Secondary | ICD-10-CM | POA: Diagnosis not present

## 2018-09-04 DIAGNOSIS — E785 Hyperlipidemia, unspecified: Secondary | ICD-10-CM | POA: Diagnosis not present

## 2018-09-04 DIAGNOSIS — E119 Type 2 diabetes mellitus without complications: Secondary | ICD-10-CM | POA: Diagnosis not present

## 2018-09-04 DIAGNOSIS — K219 Gastro-esophageal reflux disease without esophagitis: Secondary | ICD-10-CM | POA: Diagnosis not present

## 2018-09-04 DIAGNOSIS — I10 Essential (primary) hypertension: Secondary | ICD-10-CM | POA: Diagnosis not present

## 2018-09-04 DIAGNOSIS — E039 Hypothyroidism, unspecified: Secondary | ICD-10-CM | POA: Diagnosis not present

## 2018-09-04 DIAGNOSIS — Z96653 Presence of artificial knee joint, bilateral: Secondary | ICD-10-CM | POA: Diagnosis not present

## 2018-09-04 DIAGNOSIS — F419 Anxiety disorder, unspecified: Secondary | ICD-10-CM | POA: Diagnosis not present

## 2018-09-07 DIAGNOSIS — M1711 Unilateral primary osteoarthritis, right knee: Secondary | ICD-10-CM | POA: Diagnosis not present

## 2018-09-07 DIAGNOSIS — Z471 Aftercare following joint replacement surgery: Secondary | ICD-10-CM | POA: Diagnosis not present

## 2018-09-07 DIAGNOSIS — Z96653 Presence of artificial knee joint, bilateral: Secondary | ICD-10-CM | POA: Diagnosis not present

## 2018-09-09 DIAGNOSIS — M25661 Stiffness of right knee, not elsewhere classified: Secondary | ICD-10-CM | POA: Diagnosis not present

## 2018-09-09 DIAGNOSIS — Z96651 Presence of right artificial knee joint: Secondary | ICD-10-CM | POA: Diagnosis not present

## 2018-09-09 DIAGNOSIS — M533 Sacrococcygeal disorders, not elsewhere classified: Secondary | ICD-10-CM | POA: Diagnosis not present

## 2018-09-13 DIAGNOSIS — M1711 Unilateral primary osteoarthritis, right knee: Secondary | ICD-10-CM | POA: Diagnosis not present

## 2018-09-13 DIAGNOSIS — Z96651 Presence of right artificial knee joint: Secondary | ICD-10-CM | POA: Diagnosis not present

## 2018-09-14 DIAGNOSIS — M25661 Stiffness of right knee, not elsewhere classified: Secondary | ICD-10-CM | POA: Diagnosis not present

## 2018-09-14 DIAGNOSIS — M533 Sacrococcygeal disorders, not elsewhere classified: Secondary | ICD-10-CM | POA: Diagnosis not present

## 2018-09-14 DIAGNOSIS — Z96651 Presence of right artificial knee joint: Secondary | ICD-10-CM | POA: Diagnosis not present

## 2018-09-17 DIAGNOSIS — M25661 Stiffness of right knee, not elsewhere classified: Secondary | ICD-10-CM | POA: Diagnosis not present

## 2018-09-17 DIAGNOSIS — M533 Sacrococcygeal disorders, not elsewhere classified: Secondary | ICD-10-CM | POA: Diagnosis not present

## 2018-09-17 DIAGNOSIS — Z96651 Presence of right artificial knee joint: Secondary | ICD-10-CM | POA: Diagnosis not present

## 2018-09-20 DIAGNOSIS — M533 Sacrococcygeal disorders, not elsewhere classified: Secondary | ICD-10-CM | POA: Diagnosis not present

## 2018-09-20 DIAGNOSIS — Z96651 Presence of right artificial knee joint: Secondary | ICD-10-CM | POA: Diagnosis not present

## 2018-09-20 DIAGNOSIS — M25661 Stiffness of right knee, not elsewhere classified: Secondary | ICD-10-CM | POA: Diagnosis not present

## 2018-09-23 ENCOUNTER — Ambulatory Visit: Payer: Federal, State, Local not specified - PPO | Admitting: Cardiovascular Disease

## 2018-09-23 DIAGNOSIS — M533 Sacrococcygeal disorders, not elsewhere classified: Secondary | ICD-10-CM | POA: Diagnosis not present

## 2018-09-23 DIAGNOSIS — Z96651 Presence of right artificial knee joint: Secondary | ICD-10-CM | POA: Diagnosis not present

## 2018-09-23 DIAGNOSIS — M25661 Stiffness of right knee, not elsewhere classified: Secondary | ICD-10-CM | POA: Diagnosis not present

## 2018-09-27 ENCOUNTER — Ambulatory Visit (HOSPITAL_COMMUNITY)
Admission: RE | Admit: 2018-09-27 | Discharge: 2018-09-27 | Disposition: A | Discharge status: Home / Self Care | Payer: Federal, State, Local not specified - PPO | Source: Ambulatory Visit | Attending: Orthopaedic Surgery | Admitting: Orthopaedic Surgery

## 2018-09-27 ENCOUNTER — Other Ambulatory Visit (HOSPITAL_COMMUNITY): Payer: Self-pay | Admitting: Orthopaedic Surgery

## 2018-09-27 DIAGNOSIS — M25561 Pain in right knee: Secondary | ICD-10-CM | POA: Insufficient documentation

## 2018-09-27 DIAGNOSIS — M79604 Pain in right leg: Secondary | ICD-10-CM | POA: Diagnosis not present

## 2018-09-27 DIAGNOSIS — M25661 Stiffness of right knee, not elsewhere classified: Secondary | ICD-10-CM | POA: Diagnosis not present

## 2018-09-27 DIAGNOSIS — M7989 Other specified soft tissue disorders: Secondary | ICD-10-CM

## 2018-09-27 NOTE — Progress Notes (Signed)
Left lower extremity venous duplex completed. Preliminary results - There is no evidence of DVT or Baker's cyst. Natalie Hensley,RVS 09/27/2018 3:46 pm

## 2018-09-30 DIAGNOSIS — Z96651 Presence of right artificial knee joint: Secondary | ICD-10-CM | POA: Diagnosis not present

## 2018-09-30 DIAGNOSIS — M533 Sacrococcygeal disorders, not elsewhere classified: Secondary | ICD-10-CM | POA: Diagnosis not present

## 2018-09-30 DIAGNOSIS — M25661 Stiffness of right knee, not elsewhere classified: Secondary | ICD-10-CM | POA: Diagnosis not present

## 2018-10-04 DIAGNOSIS — M533 Sacrococcygeal disorders, not elsewhere classified: Secondary | ICD-10-CM | POA: Diagnosis not present

## 2018-10-04 DIAGNOSIS — Z96651 Presence of right artificial knee joint: Secondary | ICD-10-CM | POA: Diagnosis not present

## 2018-10-04 DIAGNOSIS — M25661 Stiffness of right knee, not elsewhere classified: Secondary | ICD-10-CM | POA: Diagnosis not present

## 2018-10-06 DIAGNOSIS — Z96651 Presence of right artificial knee joint: Secondary | ICD-10-CM | POA: Diagnosis not present

## 2018-10-06 DIAGNOSIS — M533 Sacrococcygeal disorders, not elsewhere classified: Secondary | ICD-10-CM | POA: Diagnosis not present

## 2018-10-06 DIAGNOSIS — M25661 Stiffness of right knee, not elsewhere classified: Secondary | ICD-10-CM | POA: Diagnosis not present

## 2018-10-12 DIAGNOSIS — M533 Sacrococcygeal disorders, not elsewhere classified: Secondary | ICD-10-CM | POA: Diagnosis not present

## 2018-10-12 DIAGNOSIS — M25661 Stiffness of right knee, not elsewhere classified: Secondary | ICD-10-CM | POA: Diagnosis not present

## 2018-10-12 DIAGNOSIS — Z96651 Presence of right artificial knee joint: Secondary | ICD-10-CM | POA: Diagnosis not present

## 2018-10-14 DIAGNOSIS — M533 Sacrococcygeal disorders, not elsewhere classified: Secondary | ICD-10-CM | POA: Diagnosis not present

## 2018-10-14 DIAGNOSIS — Z96651 Presence of right artificial knee joint: Secondary | ICD-10-CM | POA: Diagnosis not present

## 2018-10-14 DIAGNOSIS — M25661 Stiffness of right knee, not elsewhere classified: Secondary | ICD-10-CM | POA: Diagnosis not present

## 2018-10-19 DIAGNOSIS — M533 Sacrococcygeal disorders, not elsewhere classified: Secondary | ICD-10-CM | POA: Diagnosis not present

## 2018-10-19 DIAGNOSIS — Z96651 Presence of right artificial knee joint: Secondary | ICD-10-CM | POA: Diagnosis not present

## 2018-10-19 DIAGNOSIS — M25661 Stiffness of right knee, not elsewhere classified: Secondary | ICD-10-CM | POA: Diagnosis not present

## 2018-10-21 DIAGNOSIS — M533 Sacrococcygeal disorders, not elsewhere classified: Secondary | ICD-10-CM | POA: Diagnosis not present

## 2018-10-21 DIAGNOSIS — Z96651 Presence of right artificial knee joint: Secondary | ICD-10-CM | POA: Diagnosis not present

## 2018-10-21 DIAGNOSIS — M25661 Stiffness of right knee, not elsewhere classified: Secondary | ICD-10-CM | POA: Diagnosis not present

## 2018-10-26 DIAGNOSIS — Z96651 Presence of right artificial knee joint: Secondary | ICD-10-CM | POA: Diagnosis not present

## 2018-10-26 DIAGNOSIS — M25661 Stiffness of right knee, not elsewhere classified: Secondary | ICD-10-CM | POA: Diagnosis not present

## 2018-10-26 DIAGNOSIS — M533 Sacrococcygeal disorders, not elsewhere classified: Secondary | ICD-10-CM | POA: Diagnosis not present

## 2018-10-27 DIAGNOSIS — Z0000 Encounter for general adult medical examination without abnormal findings: Secondary | ICD-10-CM | POA: Diagnosis not present

## 2018-10-27 DIAGNOSIS — E113291 Type 2 diabetes mellitus with mild nonproliferative diabetic retinopathy without macular edema, right eye: Secondary | ICD-10-CM | POA: Diagnosis not present

## 2018-10-27 DIAGNOSIS — E782 Mixed hyperlipidemia: Secondary | ICD-10-CM | POA: Diagnosis not present

## 2018-10-27 DIAGNOSIS — E039 Hypothyroidism, unspecified: Secondary | ICD-10-CM | POA: Diagnosis not present

## 2018-10-27 DIAGNOSIS — I10 Essential (primary) hypertension: Secondary | ICD-10-CM | POA: Diagnosis not present

## 2018-10-27 DIAGNOSIS — M25661 Stiffness of right knee, not elsewhere classified: Secondary | ICD-10-CM | POA: Diagnosis not present

## 2018-10-28 DIAGNOSIS — M533 Sacrococcygeal disorders, not elsewhere classified: Secondary | ICD-10-CM | POA: Diagnosis not present

## 2018-10-28 DIAGNOSIS — Z96651 Presence of right artificial knee joint: Secondary | ICD-10-CM | POA: Diagnosis not present

## 2018-10-28 DIAGNOSIS — M25661 Stiffness of right knee, not elsewhere classified: Secondary | ICD-10-CM | POA: Diagnosis not present

## 2018-11-02 DIAGNOSIS — M533 Sacrococcygeal disorders, not elsewhere classified: Secondary | ICD-10-CM | POA: Diagnosis not present

## 2018-11-02 DIAGNOSIS — Z96651 Presence of right artificial knee joint: Secondary | ICD-10-CM | POA: Diagnosis not present

## 2018-11-02 DIAGNOSIS — M25661 Stiffness of right knee, not elsewhere classified: Secondary | ICD-10-CM | POA: Diagnosis not present

## 2018-11-04 DIAGNOSIS — Z96651 Presence of right artificial knee joint: Secondary | ICD-10-CM | POA: Diagnosis not present

## 2018-11-04 DIAGNOSIS — M25661 Stiffness of right knee, not elsewhere classified: Secondary | ICD-10-CM | POA: Diagnosis not present

## 2018-11-04 DIAGNOSIS — M533 Sacrococcygeal disorders, not elsewhere classified: Secondary | ICD-10-CM | POA: Diagnosis not present

## 2018-11-09 DIAGNOSIS — M25661 Stiffness of right knee, not elsewhere classified: Secondary | ICD-10-CM | POA: Diagnosis not present

## 2018-11-09 DIAGNOSIS — M533 Sacrococcygeal disorders, not elsewhere classified: Secondary | ICD-10-CM | POA: Diagnosis not present

## 2018-11-09 DIAGNOSIS — Z96651 Presence of right artificial knee joint: Secondary | ICD-10-CM | POA: Diagnosis not present

## 2018-11-11 DIAGNOSIS — Z96651 Presence of right artificial knee joint: Secondary | ICD-10-CM | POA: Diagnosis not present

## 2018-11-11 DIAGNOSIS — M533 Sacrococcygeal disorders, not elsewhere classified: Secondary | ICD-10-CM | POA: Diagnosis not present

## 2018-11-11 DIAGNOSIS — M25661 Stiffness of right knee, not elsewhere classified: Secondary | ICD-10-CM | POA: Diagnosis not present

## 2018-11-17 DIAGNOSIS — Z96651 Presence of right artificial knee joint: Secondary | ICD-10-CM | POA: Diagnosis not present

## 2018-11-17 DIAGNOSIS — M533 Sacrococcygeal disorders, not elsewhere classified: Secondary | ICD-10-CM | POA: Diagnosis not present

## 2018-11-17 DIAGNOSIS — M25661 Stiffness of right knee, not elsewhere classified: Secondary | ICD-10-CM | POA: Diagnosis not present

## 2018-11-23 DIAGNOSIS — Z96651 Presence of right artificial knee joint: Secondary | ICD-10-CM | POA: Diagnosis not present

## 2018-11-23 DIAGNOSIS — M533 Sacrococcygeal disorders, not elsewhere classified: Secondary | ICD-10-CM | POA: Diagnosis not present

## 2018-11-23 DIAGNOSIS — M25661 Stiffness of right knee, not elsewhere classified: Secondary | ICD-10-CM | POA: Diagnosis not present

## 2018-11-24 DIAGNOSIS — M25661 Stiffness of right knee, not elsewhere classified: Secondary | ICD-10-CM | POA: Diagnosis not present

## 2018-11-30 DIAGNOSIS — F320 Major depressive disorder, single episode, mild: Secondary | ICD-10-CM | POA: Diagnosis not present

## 2018-12-14 DIAGNOSIS — F320 Major depressive disorder, single episode, mild: Secondary | ICD-10-CM | POA: Diagnosis not present

## 2018-12-22 DIAGNOSIS — C73 Malignant neoplasm of thyroid gland: Secondary | ICD-10-CM | POA: Diagnosis not present

## 2018-12-28 DIAGNOSIS — H5203 Hypermetropia, bilateral: Secondary | ICD-10-CM | POA: Diagnosis not present

## 2018-12-28 DIAGNOSIS — E119 Type 2 diabetes mellitus without complications: Secondary | ICD-10-CM | POA: Diagnosis not present

## 2018-12-28 DIAGNOSIS — H524 Presbyopia: Secondary | ICD-10-CM | POA: Diagnosis not present

## 2018-12-28 DIAGNOSIS — H52203 Unspecified astigmatism, bilateral: Secondary | ICD-10-CM | POA: Diagnosis not present

## 2018-12-28 DIAGNOSIS — H2513 Age-related nuclear cataract, bilateral: Secondary | ICD-10-CM | POA: Diagnosis not present

## 2019-01-05 DIAGNOSIS — F320 Major depressive disorder, single episode, mild: Secondary | ICD-10-CM | POA: Diagnosis not present

## 2019-01-26 DIAGNOSIS — F320 Major depressive disorder, single episode, mild: Secondary | ICD-10-CM | POA: Diagnosis not present

## 2019-02-16 DIAGNOSIS — F320 Major depressive disorder, single episode, mild: Secondary | ICD-10-CM | POA: Diagnosis not present

## 2019-03-02 DIAGNOSIS — F320 Major depressive disorder, single episode, mild: Secondary | ICD-10-CM | POA: Diagnosis not present

## 2019-03-16 DIAGNOSIS — F320 Major depressive disorder, single episode, mild: Secondary | ICD-10-CM | POA: Diagnosis not present

## 2019-03-30 DIAGNOSIS — F320 Major depressive disorder, single episode, mild: Secondary | ICD-10-CM | POA: Diagnosis not present

## 2019-04-06 DIAGNOSIS — C73 Malignant neoplasm of thyroid gland: Secondary | ICD-10-CM | POA: Diagnosis not present

## 2019-04-06 DIAGNOSIS — E890 Postprocedural hypothyroidism: Secondary | ICD-10-CM | POA: Diagnosis not present

## 2019-04-19 DIAGNOSIS — F320 Major depressive disorder, single episode, mild: Secondary | ICD-10-CM | POA: Diagnosis not present

## 2019-05-11 DIAGNOSIS — F320 Major depressive disorder, single episode, mild: Secondary | ICD-10-CM | POA: Diagnosis not present

## 2019-05-31 DIAGNOSIS — F320 Major depressive disorder, single episode, mild: Secondary | ICD-10-CM | POA: Diagnosis not present

## 2019-06-22 DIAGNOSIS — F320 Major depressive disorder, single episode, mild: Secondary | ICD-10-CM | POA: Diagnosis not present

## 2019-06-23 DIAGNOSIS — J029 Acute pharyngitis, unspecified: Secondary | ICD-10-CM | POA: Diagnosis not present

## 2019-06-23 DIAGNOSIS — Z20828 Contact with and (suspected) exposure to other viral communicable diseases: Secondary | ICD-10-CM | POA: Diagnosis not present

## 2019-06-24 DIAGNOSIS — J029 Acute pharyngitis, unspecified: Secondary | ICD-10-CM | POA: Diagnosis not present

## 2019-07-12 DIAGNOSIS — F320 Major depressive disorder, single episode, mild: Secondary | ICD-10-CM | POA: Diagnosis not present

## 2019-07-13 ENCOUNTER — Other Ambulatory Visit: Payer: Self-pay | Admitting: Family Medicine

## 2019-07-13 DIAGNOSIS — Z1231 Encounter for screening mammogram for malignant neoplasm of breast: Secondary | ICD-10-CM

## 2019-07-15 ENCOUNTER — Other Ambulatory Visit: Payer: Self-pay

## 2019-07-15 ENCOUNTER — Ambulatory Visit
Admission: RE | Admit: 2019-07-15 | Discharge: 2019-07-15 | Disposition: A | Discharge status: Home / Self Care | Payer: PRIVATE HEALTH INSURANCE | Source: Ambulatory Visit | Attending: Family Medicine | Admitting: Family Medicine

## 2019-07-15 DIAGNOSIS — Z1231 Encounter for screening mammogram for malignant neoplasm of breast: Secondary | ICD-10-CM

## 2019-07-19 ENCOUNTER — Other Ambulatory Visit: Payer: Self-pay

## 2019-07-19 DIAGNOSIS — R6889 Other general symptoms and signs: Secondary | ICD-10-CM | POA: Diagnosis not present

## 2019-07-19 DIAGNOSIS — Z20822 Contact with and (suspected) exposure to covid-19: Secondary | ICD-10-CM

## 2019-07-21 LAB — NOVEL CORONAVIRUS, NAA: SARS-CoV-2, NAA: NOT DETECTED

## 2019-08-02 DIAGNOSIS — F320 Major depressive disorder, single episode, mild: Secondary | ICD-10-CM | POA: Diagnosis not present

## 2019-08-17 ENCOUNTER — Other Ambulatory Visit: Payer: Self-pay | Admitting: Cardiovascular Disease

## 2019-08-30 DIAGNOSIS — F320 Major depressive disorder, single episode, mild: Secondary | ICD-10-CM | POA: Diagnosis not present

## 2019-08-31 ENCOUNTER — Ambulatory Visit: Payer: Federal, State, Local not specified - PPO

## 2019-09-14 ENCOUNTER — Other Ambulatory Visit: Payer: Self-pay | Admitting: Cardiovascular Disease

## 2019-09-15 NOTE — Telephone Encounter (Signed)
Pt overdue for 12 month f/u. °Please contact pt for future appointment. °

## 2019-09-19 DIAGNOSIS — R1031 Right lower quadrant pain: Secondary | ICD-10-CM | POA: Diagnosis not present

## 2019-09-20 ENCOUNTER — Ambulatory Visit
Admission: RE | Admit: 2019-09-20 | Discharge: 2019-09-20 | Disposition: A | Discharge status: Home / Self Care | Payer: Federal, State, Local not specified - PPO | Source: Ambulatory Visit | Attending: Family Medicine | Admitting: Family Medicine

## 2019-09-20 ENCOUNTER — Other Ambulatory Visit: Payer: Self-pay | Admitting: Family Medicine

## 2019-09-20 DIAGNOSIS — G8929 Other chronic pain: Secondary | ICD-10-CM

## 2019-09-20 DIAGNOSIS — K5790 Diverticulosis of intestine, part unspecified, without perforation or abscess without bleeding: Secondary | ICD-10-CM | POA: Diagnosis not present

## 2019-09-20 DIAGNOSIS — K5730 Diverticulosis of large intestine without perforation or abscess without bleeding: Secondary | ICD-10-CM | POA: Diagnosis not present

## 2019-09-21 ENCOUNTER — Emergency Department (HOSPITAL_COMMUNITY)
Admission: EM | Admit: 2019-09-21 | Discharge: 2019-09-21 | Disposition: A | Discharge status: Home / Self Care | Payer: Federal, State, Local not specified - PPO | Attending: Emergency Medicine | Admitting: Emergency Medicine

## 2019-09-21 ENCOUNTER — Inpatient Hospital Stay: Admission: RE | Admit: 2019-09-21 | Payer: PRIVATE HEALTH INSURANCE | Source: Ambulatory Visit

## 2019-09-21 DIAGNOSIS — I10 Essential (primary) hypertension: Secondary | ICD-10-CM | POA: Insufficient documentation

## 2019-09-21 DIAGNOSIS — E038 Other specified hypothyroidism: Secondary | ICD-10-CM | POA: Insufficient documentation

## 2019-09-21 DIAGNOSIS — I252 Old myocardial infarction: Secondary | ICD-10-CM | POA: Insufficient documentation

## 2019-09-21 DIAGNOSIS — Z85850 Personal history of malignant neoplasm of thyroid: Secondary | ICD-10-CM | POA: Diagnosis not present

## 2019-09-21 DIAGNOSIS — I2510 Atherosclerotic heart disease of native coronary artery without angina pectoris: Secondary | ICD-10-CM | POA: Insufficient documentation

## 2019-09-21 DIAGNOSIS — E119 Type 2 diabetes mellitus without complications: Secondary | ICD-10-CM | POA: Diagnosis not present

## 2019-09-21 DIAGNOSIS — Z7901 Long term (current) use of anticoagulants: Secondary | ICD-10-CM | POA: Insufficient documentation

## 2019-09-21 DIAGNOSIS — R1031 Right lower quadrant pain: Secondary | ICD-10-CM

## 2019-09-21 LAB — COMPREHENSIVE METABOLIC PANEL
ALT: 57 U/L — ABNORMAL HIGH (ref 0–44)
AST: 53 U/L — ABNORMAL HIGH (ref 15–41)
Albumin: 4.3 g/dL (ref 3.5–5.0)
Alkaline Phosphatase: 75 U/L (ref 38–126)
Anion gap: 11 (ref 5–15)
BUN: 14 mg/dL (ref 8–23)
CO2: 23 mmol/L (ref 22–32)
Calcium: 9.7 mg/dL (ref 8.9–10.3)
Chloride: 106 mmol/L (ref 98–111)
Creatinine, Ser: 0.74 mg/dL (ref 0.44–1.00)
GFR calc Af Amer: >60 mL/min (ref >60)
GFR calc non Af Amer: >60 mL/min (ref >60)
Glucose, Bld: 117 mg/dL — ABNORMAL HIGH (ref 70–99)
Potassium: 4.3 mmol/L (ref 3.5–5.1)
Sodium: 140 mmol/L (ref 135–145)
Total Bilirubin: 0.8 mg/dL (ref 0.3–1.2)
Total Protein: 7.1 g/dL (ref 6.5–8.1)

## 2019-09-21 LAB — CBC WITH DIFFERENTIAL/PLATELET
Abs Immature Granulocytes: 0.03 10*3/uL (ref 0.00–0.07)
Basophils Absolute: 0.1 10*3/uL (ref 0.0–0.1)
Basophils Relative: 1 %
Eosinophils Absolute: 0.3 10*3/uL (ref 0.0–0.5)
Eosinophils Relative: 3 %
HCT: 47.7 % — ABNORMAL HIGH (ref 36.0–46.0)
Hemoglobin: 15.8 g/dL — ABNORMAL HIGH (ref 12.0–15.0)
Immature Granulocytes: 0 %
Lymphocytes Relative: 20 %
Lymphs Abs: 1.5 10*3/uL (ref 0.7–4.0)
MCH: 31.3 pg (ref 26.0–34.0)
MCHC: 33.1 g/dL (ref 30.0–36.0)
MCV: 94.5 fL (ref 80.0–100.0)
Monocytes Absolute: 0.9 10*3/uL (ref 0.1–1.0)
Monocytes Relative: 11 %
Neutro Abs: 4.8 10*3/uL (ref 1.7–7.7)
Neutrophils Relative %: 65 %
Platelets: 245 10*3/uL (ref 150–400)
RBC: 5.05 MIL/uL (ref 3.87–5.11)
RDW: 12 % (ref 11.5–15.5)
WBC: 7.5 10*3/uL (ref 4.0–10.5)
nRBC: 0 % (ref 0.0–0.2)

## 2019-09-21 LAB — URINALYSIS, ROUTINE W REFLEX MICROSCOPIC
Bilirubin Urine: NEGATIVE
Glucose, UA: NEGATIVE mg/dL
Hgb urine dipstick: NEGATIVE
Ketones, ur: NEGATIVE mg/dL
Leukocytes,Ua: NEGATIVE
Nitrite: NEGATIVE
Protein, ur: NEGATIVE mg/dL
Specific Gravity, Urine: 1.01 (ref 1.005–1.030)
pH: 5 (ref 5.0–8.0)

## 2019-09-21 LAB — LIPASE, BLOOD: Lipase: 34 U/L (ref 11–51)

## 2019-09-21 MED ORDER — HYDROCODONE-ACETAMINOPHEN 5-325 MG PO TABS: 1.0000 | ORAL_TABLET | Freq: Four times a day (QID) | ORAL | 0 refills | Status: DC | PRN

## 2019-09-21 MED ORDER — IBUPROFEN 800 MG PO TABS
800.0000 mg | ORAL_TABLET | Freq: Once | ORAL | Status: AC
  Administered 2019-09-21: 21:00:00 800 mg via ORAL
  Filled 2019-09-21: qty 1

## 2019-09-21 NOTE — ED Provider Notes (Signed)
[facility] EMERGENCY DEPARTMENT Provider Note   CSN: [id number] Arrival date & time: 09/21/19  1155     History   Chief Complaint No chief complaint on file.   HPI Natalie Hensley is a 63 y.o. female.     HPI Patient reports that she developed a very severe pain in her right lower abdominal area about 4 days ago.  She reports at onset she was noticing pain along her flank on the right that wrapped around towards her groin.  She reports that the pain now however has settled to be very much in the groin\lower abdomen area.  He reports has become exquisitely painful with certain positions.  She reports that she can lie on the left and be comfortable for a little while but then has to shift positions.  She reports that she cannot lie on her right side because it is severely painful.  She reports lying completely flat is quite painful and being in a sitting up position is also very painful.  She reports she is better for a bit if she is up and moving around some but then that becomes uncomfortable as well.  She has not had any vomiting.  No urinary symptoms.  No pain no burning or urgency.  No weakness or numbness in the leg.  Patient had been seen by her PCP day before yesterday.  They did lab work and a CT scan yesterday.  CT did not show any acute findings.  Patient reports she continues to be in a lot of pain.  She was prescribed tramadol but has not taken it.  She was referred to come to the emergency department for worsening pain.   She had been pretty inactive for a while because of Covid and prior to that knee surgeries.  She has now resumed exercising by walking briskly 30 minutes daily.  She has been doing this for a couple of weeks.   Past Medical History:  Diagnosis Date  . Anxiety   . Arthritis    bilateral knees, right hip  . Carpal tunnel syndrome   . Complication of anesthesia   . Coronary artery disease   . Depression   . Family history of adverse  reaction to anesthesia    SISTER HAS NAUSEA   . GERD (gastroesophageal reflux disease)   . HTN (hypertension)   . Hyperlipidemia   . Hypothyroidism    No thyroid  . Myocardial infarction ([place])   . PONV (postoperative nausea and vomiting)   . Thyroid cancer ([place])   . Type II diabetes mellitus ([place])   . Urinary frequency     Patient Active Problem List   Diagnosis Date Noted  . Primary osteoarthritis of right knee 08/31/2018  . Primary localized osteoarthritis of left knee 07/21/2017  . Primary osteoarthritis of left knee 07/21/2017  . Chest pain 02/22/2014  . Myocardial infarction ([place])   . Hyperlipidemia   . HTN (hypertension)   . Urinary frequency   . Depression   . Thyroid cancer ([place])   . Anxiety   . Diabetes ([place])   . Goiter   . Hypothyroidism   . Carpal tunnel syndrome     Past Surgical History:  Procedure Laterality Date  . APPENDECTOMY    . bone infection  Left    left shin surgery by Dr Rhona Raider  . COLONOSCOPY    . CORONARY ARTERY BYPASS GRAFT  07/27/2013  . JOINT REPLACEMENT    . LAPAROSCOPIC APPENDECTOMY    .  LAPAROSCOPIC CHOLECYSTECTOMY  04/22/2007   Archie Endo 04/22/2007  . TOTAL ABDOMINAL HYSTERECTOMY    . TOTAL KNEE ARTHROPLASTY Left 07/21/2017   Procedure: TOTAL KNEE ARTHROPLASTY;  Surgeon: Melrose Nakayama, MD;  Location: [facility];  Service: Orthopedics;  Laterality: Left;  . TOTAL KNEE ARTHROPLASTY Right 08/31/2018  . TOTAL KNEE ARTHROPLASTY Right 08/31/2018   Procedure: RIGHT TOTAL KNEE ARTHROPLASTY;  Surgeon: Melrose Nakayama, MD;  Location: [facility];  Service: Orthopedics;  Laterality: Right;     OB History   No obstetric history on file.      Home Medications    Prior to Admission medications   Medication Sig Start Date End Date Taking? Authorizing Provider  ALPRAZolam (XANAX) 0.25 MG tablet Take 0.125 mg by mouth daily as needed for anxiety.     [provider]  apixaban (ELIQUIS) 2.5 MG TABS tablet Take 1 tablet (2.5 mg total) by mouth 2  (two) times daily. 09/01/18   Loni Dolly, PA-C  bisacodyl (DULCOLAX) 5 MG EC tablet Take 1 tablet (5 mg total) by mouth daily as needed for moderate constipation. 09/01/18   Loni Dolly, PA-C  busPIRone (BUSPAR) 10 MG tablet Take 10 mg by mouth 2 (two) times daily.    [provider]  docusate sodium (COLACE) 100 MG capsule Take 1 capsule (100 mg total) by mouth 2 (two) times daily. 09/01/18   Loni Dolly, PA-C  ezetimibe (ZETIA) 10 MG tablet TAKE 1 TABLET BY MOUTH EVERY DAY 08/18/19   Josue Hector, MD  HYDROcodone-acetaminophen (NORCO/VICODIN) 5-325 MG tablet Take 1-2 tablets by mouth every 6 (six) hours as needed for moderate pain (pain score 4-6). 09/01/18   Loni Dolly, PA-C  HYDROcodone-acetaminophen (NORCO/VICODIN) 5-325 MG tablet Take 1-2 tablets by mouth every 6 (six) hours as needed for moderate pain or severe pain. 09/21/19   Charlesetta Shanks, MD  levothyroxine (SYNTHROID, LEVOTHROID) 125 MCG tablet Take 125 mcg by mouth daily before breakfast.    [provider]  metoprolol (LOPRESSOR) 50 MG tablet Take 12.5 mg by mouth 2 (two) times daily. 06/25/15   [provider]  nitroGLYCERIN (NITROSTAT) 0.4 MG SL tablet PLACE 1 TABLET UNDER TONGUE EVERY 5 MINUTES AS NEEDED FOR CHEST PAIN 06/25/17   Josue Hector, MD  omeprazole (PRILOSEC) 20 MG capsule Take 20 mg by mouth at bedtime.     [provider]  simvastatin (ZOCOR) 40 MG tablet TAKE 1 TABLET BY MOUTH EVERY DAY IN THE EVENING 09/15/19   Josue Hector, MD    Family History Family History  Problem Relation Age of Onset  . CAD Father   . Hypertension Father   . Heart disease Father   . Diabetes Brother   . Cancer Brother   . Cancer Mother   . Cancer Sister   . Hypertension Sister   . Heart disease Paternal Grandmother   . Hyperlipidemia Paternal Grandmother   . Heart failure Paternal Grandmother   . Coronary artery disease Paternal Grandfather   . Alzheimer's disease Maternal Grandmother    . Heart attack Maternal Grandfather     Social History Social History   Tobacco Use  . Smoking status: Never Smoker  . Smokeless tobacco: Never Used  Substance Use Topics  . Alcohol use: Yes    Comment: social  . Drug use: No     Allergies   Sulfa antibiotics   Review of Systems Review of Systems 10 Systems reviewed and are negative for acute change except as noted in the HPI.  Physical Exam Updated Vital Signs BP (!) 143/96   Pulse 80   Temp 98.4 F (36.9 C) (Oral)   Resp 14   SpO2 92%   Physical Exam Constitutional:      Comments: Patient is alert and appropriate.  No respiratory distress.  Nontoxic.  HENT:     Head: Normocephalic and atraumatic.  Cardiovascular:     Rate and Rhythm: Normal rate and regular rhythm.  Pulmonary:     Effort: Pulmonary effort is normal.     Breath sounds: Normal breath sounds.  Abdominal:     Comments: Abdominal examination is soft without guarding or tenderness.  I have done extensive examination of the abdomen to localize the.  Area of pain.  Pain is very focal along the inguinal ligament.  This goes from right along the suprapubic bone and and follows the direct line up to the anterior iliac spine.  I get just above this I can palpate deeply into the soft tissues of the abdomen without pain.  There is no palpable hernia.  The femoral pulses 2+ and strong.  Patient has no tenderness or mass or fullness around the vascular bundle area.  There is no groin lymphadenopathy.  No fullness or tenderness in the high thigh to suggest a femoral hernia.  I palpated the patient's back and she has no CVA tenderness.  No significant tenderness along the bony prominences of the lumbar spine or the posterior iliac wing.  Musculoskeletal: Normal range of motion.     Comments: I can put patient's right lower extremity through flexion and extension without difficulty.  There is no numbness to the leg.  Strength is intact.  No peripheral edema or calf  tenderness.  Skin:    General: Skin is warm and dry.  Neurological:     General: No focal deficit present.     Mental Status: She is oriented to person, place, and time.     Coordination: Coordination normal.  Psychiatric:        Mood and Affect: Mood normal.      ED Treatments / Results  Labs (all labs ordered are listed, but only abnormal results are displayed) Labs Reviewed  COMPREHENSIVE METABOLIC PANEL - Abnormal; Notable for the following components:      Result Value   Glucose, Bld 117 (*)    AST 53 (*)    ALT 57 (*)    All other components within normal limits  CBC WITH DIFFERENTIAL/PLATELET - Abnormal; Notable for the following components:   Hemoglobin 15.8 (*)    HCT 47.7 (*)    All other components within normal limits  URINALYSIS, ROUTINE W REFLEX MICROSCOPIC - Abnormal; Notable for the following components:   Color, Urine STRAW (*)    All other components within normal limits  LIPASE, BLOOD    EKG None  Radiology Ct Abdomen Pelvis Wo Contrast  Result Date: 09/20/2019 CLINICAL DATA:  Right lower quadrant abdominal pain. History of appendectomy and cholecystectomy EXAM: CT ABDOMEN AND PELVIS WITHOUT CONTRAST TECHNIQUE: Multidetector CT imaging of the abdomen and pelvis was performed following the standard protocol without IV contrast. COMPARISON:  CT 03/22/2012 FINDINGS: Lower chest: No acute abnormality. Hepatobiliary: Markedly decreased attenuation of the hepatic parenchyma compatible with hepatic steatosis. Status post cholecystectomy. No biliary dilatation. Pancreas: Unremarkable. No pancreatic ductal dilatation or surrounding inflammatory changes. Spleen: Normal in size without focal abnormality. Adrenals/Urinary Tract: Adrenal glands are unremarkable. Kidneys are normal, without renal calculi, focal lesion, or hydronephrosis. Urinary bladder wall  appears mildly thickened and irregular, although is incompletely distended which limits its evaluation.  Stomach/Bowel: Stomach is within normal limits. Appendix is surgically absent. Numerous sigmoid diverticula. No evidence of bowel wall thickening, distention, or inflammatory changes. Vascular/Lymphatic: Scattered aortoiliac atherosclerotic calcifications. No abdominopelvic lymphadenopathy. Reproductive: Status post hysterectomy. No adnexal masses. Other: No abdominal wall hernia or abnormality. No abdominopelvic ascites. Musculoskeletal: Lower lumbar spondylosis, progressed from prior. No acute osseous findings. IMPRESSION: 1. Negative for obstructive uropathy. 2. Status post appendectomy and cholecystectomy. 3. Diverticulosis without evidence of acute diverticulitis. 4. Marked hepatic steatosis. Electronically Signed   By: Davina Poke M.D.   On: 09/20/2019 09:50    Procedures Procedures (including critical care time)  Medications Ordered in ED Medications  ibuprofen (ADVIL) tablet 800 mg (800 mg Oral Given 09/21/19 2121)     Initial Impression / Assessment and Plan / ED Course  I have reviewed the triage vital signs and the nursing notes.  Pertinent labs & imaging results that were available during my care of the patient were reviewed by me and considered in my medical decision making (see chart for details).        Patient has pain very local to the inguinal ligament area.  This was significantly reproduced by putting the patient in a full vertical sitting position with the legs extended in front of her.  There was no reproducible intra-abdominal tenderness.  Her CT scan did not identify any acute findings.  Patient already has had cholecystectomy total hysterectomy and appendectomy.  I suspect with patient becoming more aggressive and doing walking exercise over the past 2 weeks there is likely a strain or overuse pattern.  At this time will start on higher dose NSAID.  We discussed the use of NSAIDs on the short-term to gain control over acute pain.  Patient will also take Vicodin for  pain for the next few days.  Return precautions have been reviewed.  Patient is counseled to follow-up with her PCP and orthopedics for further evaluation.  She was advised that she may need follow-up MRI of the hip or pelvis if symptoms are worsening and persisting despite treatment.  At this time, I feel she is stable for continued outpatient management and follow-up.  Final Clinical Impressions(s) / ED Diagnoses   Final diagnoses:  Deep right inguinal pain    ED Discharge Orders         Ordered    HYDROcodone-acetaminophen (NORCO/VICODIN) 5-325 MG tablet  Every 6 hours PRN     09/21/19 2115           Charlesetta Shanks, MD 09/21/19 2134

## 2019-09-21 NOTE — Discharge Instructions (Signed)
1.  Your pain is along the inguinal ligament.  I suspect this is from inflammation and overuse type injury from more recent increase in walking exercises.  This area is however a common place to get a hernia.  At this time, I do not feel any hernia bulge present.  Also recently had a CT scan that does not show a hernia.  However, if you start developing worsening pain that is in your abdomen or notice an area of swelling, that needs to be checked immediately. 2.  At this time take ibuprofen 800 mg 3 times a day for the next 24 to 48 hours.  Then, start to taper this down to 400 mg.  With a history of hypertension and heart disease, you do not want to have prolonged use of NSAID type anti-inflammatory medications.  You may also take 1-2 Vicodin every 6 hours for additional pain control. 3.  If you have any tendency to get constipated, take Colace 2 times daily to prevent constipation.  Start MiraLAX daily if you have not had a normal bowel movement after 2 to 3 days. 4.  Follow-up with your family doctor and your orthopedic doctors for recheck. 5.  I have included information regarding inguinal hernias for your review.  At this time I do not identify anything that would suggest an incarcerated hernia.

## 2019-09-21 NOTE — ED Triage Notes (Signed)
Pt here from home with c/o abd pain that is on the rlq , nauseated .pt had a CT scan yesterday that was normal , her MD sent her to the ED for further workup , pt was prescribed tramadol but did not take it

## 2019-09-26 ENCOUNTER — Other Ambulatory Visit: Payer: Self-pay | Admitting: Cardiovascular Disease

## 2019-09-26 DIAGNOSIS — M25661 Stiffness of right knee, not elsewhere classified: Secondary | ICD-10-CM | POA: Diagnosis not present

## 2019-09-26 DIAGNOSIS — Z96653 Presence of artificial knee joint, bilateral: Secondary | ICD-10-CM | POA: Diagnosis not present

## 2019-09-28 DIAGNOSIS — F320 Major depressive disorder, single episode, mild: Secondary | ICD-10-CM | POA: Diagnosis not present

## 2019-10-11 DIAGNOSIS — F320 Major depressive disorder, single episode, mild: Secondary | ICD-10-CM | POA: Diagnosis not present

## 2019-10-17 DIAGNOSIS — L03114 Cellulitis of left upper limb: Secondary | ICD-10-CM | POA: Diagnosis not present

## 2019-10-20 DIAGNOSIS — F320 Major depressive disorder, single episode, mild: Secondary | ICD-10-CM | POA: Diagnosis not present

## 2019-10-23 ENCOUNTER — Other Ambulatory Visit: Payer: Self-pay | Admitting: Cardiovascular Disease

## 2019-11-17 DIAGNOSIS — F320 Major depressive disorder, single episode, mild: Secondary | ICD-10-CM | POA: Diagnosis not present

## 2019-11-30 DIAGNOSIS — Z0000 Encounter for general adult medical examination without abnormal findings: Secondary | ICD-10-CM | POA: Diagnosis not present

## 2019-12-01 ENCOUNTER — Ambulatory Visit: Payer: PRIVATE HEALTH INSURANCE | Admitting: Cardiovascular Disease

## 2019-12-08 DIAGNOSIS — F320 Major depressive disorder, single episode, mild: Secondary | ICD-10-CM | POA: Diagnosis not present

## 2019-12-08 NOTE — Progress Notes (Deleted)
Patient ID: Natalie Hensley, female   DOB: [birth year], 64 y.o.   MRN: [id number]   This is a 64 y.o. patient had an MI in [place] followed by a CABG on 07/27/13 with a LIMA to the LAD, SVG to the RCA, and SVG to the diagonal 1. EF was normal. Postop course was complicated by sternal wound infection treated with antibiotics and wound VAC.   Seeing therapist for anxiety. 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  All other systems reviewed and negative  General: There were no vitals taken for this visit. Affect appropriate Healthy:  appears stated age 64: normal Neck supple with no adenopathy JVP normal no bruits no thyromegaly Lungs clear with no wheezing and good diaphragmatic motion Heart:  S1/S2 no murmur, no rub, gallop or click PMI normal Abdomen: benighn, BS positve, no tenderness, no AAA no bruit.  No HSM or HJR post cholecystectomy and appendectomy  Distal pulses intact with no bruits No edema Neuro non-focal Skin warm and dry Post bilateral TKR's      Current Outpatient Medications  Medication Sig Dispense Refill  . ALPRAZolam (XANAX) 0.25 MG tablet Take 0.125 mg by mouth daily as needed for anxiety.     Marland Kitchen apixaban (ELIQUIS) 2.5 MG TABS tablet Take 1 tablet (2.5 mg total) by mouth 2 (two) times daily. 60 tablet 2  . bisacodyl (DULCOLAX) 5 MG EC tablet Take 1 tablet (5 mg total) by mouth daily as needed for moderate constipation. 15 tablet 0  . busPIRone (BUSPAR) 10 MG tablet Take 10 mg by mouth 2 (two) times daily.    Marland Kitchen docusate sodium (COLACE) 100 MG capsule Take  1 capsule (100 mg total) by mouth 2 (two) times daily. 30 capsule 0  . ezetimibe (ZETIA) 10 MG tablet TAKE 1 TABLET BY MOUTH EVERY DAY 30 tablet 3  . HYDROcodone-acetaminophen (NORCO/VICODIN) 5-325 MG tablet Take 1-2 tablets by mouth every 6 (six) hours as needed for moderate pain (pain score 4-6). 40 tablet 0  . HYDROcodone-acetaminophen (NORCO/VICODIN) 5-325 MG tablet Take 1-2 tablets by mouth every 6 (six) hours as needed for moderate pain or severe pain. 20 tablet 0  . levothyroxine (SYNTHROID, LEVOTHROID) 125 MCG tablet Take 125 mcg by mouth daily before breakfast.    . metoprolol (LOPRESSOR) 50 MG tablet Take 12.5 mg by mouth 2 (two) times daily.  3  . nitroGLYCERIN (NITROSTAT) 0.4 MG SL tablet PLACE 1 TABLET UNDER TONGUE EVERY 5 MINUTES AS NEEDED FOR CHEST PAIN 25 tablet 3  . omeprazole (PRILOSEC) 20 MG capsule Take 20 mg by mouth at bedtime.     . simvastatin (ZOCOR) 40 MG tablet TAKE 1 TABLET BY MOUTH EVERY DAY IN THE EVENING 90 tablet 0   No current facility-administered medications for this visit.    Allergies  Sulfa antibiotics  Electrocardiogram:  SR rate 96  Poor R wave progression cannot r/o anterior MI  07/17/15  SR rate 62  Normal  06/25/17 SR rate 73 LAE low voltage   Assessment and Plan  CAD/CABG:  07/27/13 CABG with LIMA to LAD, SVG to D1 and  SVG RCA.  Non ischemic myovue  07/01/17   Continue asa and beta blocker   Chol: update labs with primary continue statin   Thyroid:  Stable on replacement  Labs with Dr Soyla Murphy   DM:  Discussed low carb diet.  Target hemoglobin A1c is 6.5 or less.  Continue current medications.  GERD:  Improved continue prilosec  Anxiety:  F/u therapist  Ok to use HPTA and other natural remedies  On Buspar now   Ortho:  Post left TKR done by Dr Novella Olive 07/21/17 with post op DVT but only took asa.  Post right TKR 08/31/18 was d/c with low dose eliquis after 2nd surgery   Jenkins Rouge

## 2019-12-14 ENCOUNTER — Ambulatory Visit: Payer: PRIVATE HEALTH INSURANCE | Admitting: Cardiovascular Disease

## 2020-01-05 DIAGNOSIS — F320 Major depressive disorder, single episode, mild: Secondary | ICD-10-CM | POA: Diagnosis not present

## 2020-01-11 ENCOUNTER — Other Ambulatory Visit: Payer: Self-pay | Admitting: Cardiovascular Disease

## 2020-01-21 ENCOUNTER — Other Ambulatory Visit: Payer: Self-pay | Admitting: Cardiovascular Disease

## 2020-01-31 DIAGNOSIS — F320 Major depressive disorder, single episode, mild: Secondary | ICD-10-CM | POA: Diagnosis not present

## 2020-02-19 ENCOUNTER — Other Ambulatory Visit: Payer: Self-pay | Admitting: Cardiovascular Disease

## 2020-02-20 DIAGNOSIS — F320 Major depressive disorder, single episode, mild: Secondary | ICD-10-CM | POA: Diagnosis not present

## 2020-02-28 DIAGNOSIS — E559 Vitamin D deficiency, unspecified: Secondary | ICD-10-CM | POA: Diagnosis not present

## 2020-02-28 DIAGNOSIS — E113291 Type 2 diabetes mellitus with mild nonproliferative diabetic retinopathy without macular edema, right eye: Secondary | ICD-10-CM | POA: Diagnosis not present

## 2020-02-28 DIAGNOSIS — I10 Essential (primary) hypertension: Secondary | ICD-10-CM | POA: Diagnosis not present

## 2020-02-28 DIAGNOSIS — E782 Mixed hyperlipidemia: Secondary | ICD-10-CM | POA: Diagnosis not present

## 2020-02-28 NOTE — Progress Notes (Signed)
Patient ID: Natalie Hensley, female   DOB: 1956-03-18, 64 y.o.   MRN: [id number]     This is a 64 y.o. patient had an MI in [place] followed by a CABG on 07/27/13 with a LIMA to the LAD, SVG to the RCA, and SVG to the diagonal 1. EF was normal. Postop course was complicated by sternal wound infection treated with antibiotics and wound VAC.   Seeing therapist for anxiety. 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[place]    Has neuropathic pain and small keloid at sternotomy sight   Left TKR 99991111  complicated by LLE DVT Completed 6 months anticoagulation Right TKR 08/31/18 with Dr Rhona Raider d/c on eliquis   Labs still bad with LDL 83 and triglycerides over 400 on zetia and zocor  She has been getting occasional chest pain although atypical and not always exertional Has not taken nitro but just got it refilled   ROS: Denies fever, malais, weight loss, blurry vision, decreased visual acuity, cough, sputum, SOB, hemoptysis, pleuritic pain, palpitaitons, heartburn, abdominal pain, melena, lower extremity edema, claudication, or rash.  All other systems reviewed and negative  General: There were no vitals taken for this visit. Affect appropriate Healthy:  appears stated age 64: normal Neck supple with no adenopathy JVP normal no bruits no thyromegaly Lungs clear with no wheezing and good diaphragmatic motion Heart:  S1/S2 no murmur, no rub, gallop or click PMI normal Abdomen: benighn, BS positve, no tenderness, no AAA no bruit.  No HSM or HJR Distal pulses intact with no bruits No edema Neuro non-focal Skin warm and dry Post bilateral TKR      Current Outpatient Medications  Medication Sig Dispense Refill  . ALPRAZolam (XANAX) 0.25 MG tablet Take 0.125 mg by mouth daily as needed for anxiety.     Marland Kitchen apixaban (ELIQUIS) 2.5 MG TABS tablet Take 1 tablet (2.5 mg total) by mouth 2 (two) times daily. 60 tablet 2  . bisacodyl (DULCOLAX) 5 MG EC tablet Take 1 tablet (5 mg total)  by mouth daily as needed for moderate constipation. 15 tablet 0  . busPIRone (BUSPAR) 10 MG tablet Take 10 mg by mouth 2 (two) times daily.    Marland Kitchen docusate sodium (COLACE) 100 MG capsule Take 1 capsule (100 mg total) by mouth 2 (two) times daily. 30 capsule 0  . ezetimibe (ZETIA) 10 MG tablet TAKE 1 TAB BY MOUTH DAILY. PLEASE KEEP UPCOMING APPT IN APRIL WITH DR. Johnsie Cancel. THANK YOU 30 tablet 0  . HYDROcodone-acetaminophen (NORCO/VICODIN) 5-325 MG tablet Take 1-2 tablets by mouth every 6 (six) hours as needed for moderate pain (pain score 4-6). 40 tablet 0  . HYDROcodone-acetaminophen (NORCO/VICODIN) 5-325 MG tablet Take 1-2 tablets by mouth every 6 (six) hours as needed for moderate pain or severe pain. 20 tablet 0  . levothyroxine (SYNTHROID, LEVOTHROID) 125 MCG tablet Take 125 mcg by mouth daily before breakfast.    . metoprolol (LOPRESSOR) 50 MG tablet Take 12.5 mg by mouth 2 (two) times daily.  3  . nitroGLYCERIN (NITROSTAT) 0.4 MG SL tablet PLACE 1 TABLET UNDER TONGUE EVERY 5 MINUTES AS NEEDED FOR CHEST PAIN 25 tablet 3  . omeprazole (PRILOSEC) 20 MG capsule Take 20 mg by mouth at bedtime.     . simvastatin (ZOCOR) 40 MG tablet TAKE 1 TABLET BY MOUTH EVERY DAY IN THE EVENING 90 tablet 0   No current facility-administered medications for this visit.    Allergies  Sulfa antibiotics  Electrocardiogram:   06/25/17 SR rate 73 LAE low voltage   Assessment and Plan  CAD/CABG:  07/27/13 CABG with LIMA to LAD, SVG to D1 and SVG RCA.  Non ischemic myovue  07/01/17   Continue asa and beta blocker Some chest pain with her recent inactivity and weight gain F/U lexiscan myovue ordered Prefer not to get her on treadmill with bilateral TKR;s   Chol:  Continue Zetia Change zocor to crestor 20 mg daily She indicates myalgias with lipitor before Start Vascepa 4 grams daily f/u in lipid clinic in 3 weeks    Thyroid:  Stable on replacement  Labs with Dr Soyla Murphy   DM:  Discussed low carb diet.  Target hemoglobin  A1c is 6.5 or less.  Continue current medications.  GERD:  Improved continue prilosec  Anxiety:  F/u therapist  Ok to use HPTA and other natural remedies  On Buspar now   Ortho:  Post bilateral TKR;s with improved mobility  [employer]

## 2020-03-01 DIAGNOSIS — Z23 Encounter for immunization: Secondary | ICD-10-CM | POA: Diagnosis not present

## 2020-03-05 ENCOUNTER — Other Ambulatory Visit: Payer: Self-pay

## 2020-03-05 ENCOUNTER — Ambulatory Visit: Payer: Federal, State, Local not specified - PPO | Admitting: Cardiovascular Disease

## 2020-03-05 ENCOUNTER — Encounter: Payer: Self-pay | Admitting: Cardiovascular Disease

## 2020-03-05 DIAGNOSIS — E785 Hyperlipidemia, unspecified: Secondary | ICD-10-CM

## 2020-03-05 DIAGNOSIS — R079 Chest pain, unspecified: Secondary | ICD-10-CM

## 2020-03-05 DIAGNOSIS — Z951 Presence of aortocoronary bypass graft: Secondary | ICD-10-CM | POA: Diagnosis not present

## 2020-03-05 MED ORDER — ICOSAPENT ETHYL 1 G PO CAPS: 2.0000 g | ORAL_CAPSULE | Freq: Two times a day (BID) | ORAL | 11 refills | Status: DC

## 2020-03-05 MED ORDER — EZETIMIBE 10 MG PO TABS: 10.0000 mg | ORAL_TABLET | Freq: Every day | ORAL | 3 refills | Status: DC

## 2020-03-05 MED ORDER — ASPIRIN EC 81 MG PO TBEC: 81.0000 mg | DELAYED_RELEASE_TABLET | Freq: Every day | ORAL | Status: AC

## 2020-03-05 MED ORDER — ROSUVASTATIN CALCIUM 20 MG PO TABS: 20.0000 mg | ORAL_TABLET | Freq: Every day | ORAL | 3 refills | Status: DC

## 2020-03-05 NOTE — Patient Instructions (Addendum)
Medication Instructions:  Your physician has recommended you make the following change in your medication:  1-STOP Simvastatin 2-START Crestor 20 mg by mouth daily 3-START Vascepa 2 grams ( 2 tablets) by mouth twice daily  *If you need a refill on your cardiac medications before your next appointment, please call your pharmacy*  Lab Work: If you have labs (blood work) drawn today and your tests are completely normal, you will receive your results only by: Marland Kitchen MyChart Message (if you have MyChart) OR . A paper copy in the mail If you have any lab test that is abnormal or we need to change your treatment, we will call you to review the results.  Testing/Procedures: Your physician has requested that you have a lexiscan myoview. For further information please visit [email or web address]. Please follow instruction sheet, as given.  Follow-Up: At [facility], you and your health needs are our priority.  As part of our continuing mission to provide you with exceptional heart care, we have created designated Provider Care Teams.  These Care Teams include your primary Cardiologist (physician) and Advanced Practice Providers (APPs -  Physician Assistants and Nurse Practitioners) who all work together to provide you with the care you need, when you need it.  We recommend signing up for the patient portal called "MyChart".  Sign up information is provided on this After Visit Summary.  MyChart is used to connect with patients for Virtual Visits (Telemedicine).  Patients are able to view lab/test results, encounter notes, upcoming appointments, etc.  Non-urgent messages can be sent to your provider as well.   To learn more about what you can do with MyChart, go to [email or web address].    Your next appointment:   3 month(s)  The format for your next appointment:   In Person  Provider:   You may see Dr. Johnsie Cancel or one of the following Advanced Practice Providers on your designated Care Team:     Truitt Merle, NP  Cecilie Kicks, NP  Kathyrn Drown, NP  You have been referred to [facility] in 3 weeks

## 2020-03-09 ENCOUNTER — Telehealth: Payer: Self-pay | Admitting: Cardiovascular Disease

## 2020-03-09 NOTE — Telephone Encounter (Signed)
Called patient back about Mychart message. Patient is going to cut back on the fat in her diet for now.

## 2020-03-09 NOTE — Telephone Encounter (Signed)
    Pt is calling, she said she has a follow up questions from her mychart message sent yesterday. She was told to cut out fat on her diet and wasn't sure what it means. She would like to know if Pam can call her back.   Please call

## 2020-03-12 ENCOUNTER — Telehealth (HOSPITAL_COMMUNITY): Payer: Self-pay | Admitting: *Deleted

## 2020-03-12 NOTE — Telephone Encounter (Signed)
Left message on voicemail per DPR in reference to upcoming appointment scheduled on 03/16/20 at 7:30 with detailed instructions given per Myocardial Perfusion Study Information Sheet for the test. LM to arrive 15 minutes early, and that it is imperative to arrive on time for appointment to keep from having the test rescheduled. If you need to cancel or reschedule your appointment, please call the office within 24 hours of your appointment. Failure to do so may result in a cancellation of your appointment, and a $50 no show fee. Phone number given for call back for any questions.

## 2020-03-13 DIAGNOSIS — F320 Major depressive disorder, single episode, mild: Secondary | ICD-10-CM | POA: Diagnosis not present

## 2020-03-16 ENCOUNTER — Other Ambulatory Visit: Payer: Self-pay

## 2020-03-16 ENCOUNTER — Ambulatory Visit (HOSPITAL_COMMUNITY): Payer: Federal, State, Local not specified - PPO | Attending: Internal Medicine

## 2020-03-16 DIAGNOSIS — R079 Chest pain, unspecified: Secondary | ICD-10-CM | POA: Diagnosis not present

## 2020-03-16 DIAGNOSIS — Z951 Presence of aortocoronary bypass graft: Secondary | ICD-10-CM | POA: Insufficient documentation

## 2020-03-16 LAB — MYOCARDIAL PERFUSION IMAGING
LV dias vol: 63 mL (ref 46–106)
LV sys vol: 19 mL
Peak HR: 104 {beats}/min
Rest HR: 66 {beats}/min
SDS: 0
SRS: 2
SSS: 2
TID: 0.9

## 2020-03-16 MED ORDER — REGADENOSON 0.4 MG/5ML IV SOLN
0.4000 mg | Freq: Once | INTRAVENOUS | Status: AC
  Administered 2020-03-16: 0.4 mg via INTRAVENOUS

## 2020-03-16 MED ORDER — TECHNETIUM TC 99M TETROFOSMIN IV KIT
32.4000 | PACK | Freq: Once | INTRAVENOUS | Status: AC | PRN
  Administered 2020-03-16: 32.4 via INTRAVENOUS
  Filled 2020-03-16: qty 33

## 2020-03-16 MED ORDER — TECHNETIUM TC 99M TETROFOSMIN IV KIT
10.3000 | PACK | Freq: Once | INTRAVENOUS | Status: AC | PRN
  Administered 2020-03-16: 10.3 via INTRAVENOUS
  Filled 2020-03-16: qty 11

## 2020-03-27 ENCOUNTER — Other Ambulatory Visit: Payer: Self-pay

## 2020-03-27 ENCOUNTER — Ambulatory Visit (INDEPENDENT_AMBULATORY_CARE_PROVIDER_SITE_OTHER): Payer: Federal, State, Local not specified - PPO | Admitting: Pharmacist

## 2020-03-27 DIAGNOSIS — E785 Hyperlipidemia, unspecified: Secondary | ICD-10-CM | POA: Diagnosis not present

## 2020-03-27 NOTE — Patient Instructions (Addendum)
It was a pleasure to meet you!  Please try to add 2-3 days of resistance training to your exercise routine. Bands and planks  Try to decrease carbs- salads instead of sandwich  Repeat labs on 7/19  Call us at [phone] with any questions or concerns

## 2020-03-27 NOTE — Progress Notes (Signed)
Patient ID: Natalie Hensley                 DOB: 03/05/1956                    MRN: [id number]     HPI: Natalie Hensley is a 64 y.o. female patient referred to lipid clinic by Dr. Johnsie Cancel. PMH is significant for MOI, s/p CABG in 2014, anxiety, DVT s/p knee replacement, HLD, DM last A1C 7.2. At last visit with Dr. Johnsie Cancel on 4/26, patient zocor was changed to rosuvastatin 20mg  daily and Vascepa 2g BID was prescribed. However, patient copay for vascepa was $100.   Patient presents today to the lipid clinic. Luckily patient has Pharmacist, community and we can use a copay card for [employer]. I have called in copay card to [facility] for her. Patient states that her diet was poor during COVID, but she has improved it. No issues with rosuvastatin. Answered questions about timing of medications.  Current Medications: rosuvastatin 20mg  daily, zetia 10mg  daily,  Intolerances:  Risk Factors: CAD, DM LDL goal: <55 TG <150  Diet: breakfast: bran flakes, blueberries with skim milk. Small piece of wheat bread with natural PB Snack: PB crackers Lunch: tuna sandwich, chicken salad sandwich or salad- no dressing- tisquite crackers, apple Dinner: chicken or fish sir-fry with vegetables, sometimes pizza decaff coffee with skim milk, unsweet tea, water  Exercise: 5 days a week walking or riding bike for 30 min  Labs: 02/28/20 TC 211, HDL 51, TG 483, LDL-C 83 (simvastatin 40, zetia 10mg  daily)  Past Medical History:  Diagnosis Date  . Anxiety   . Arthritis    bilateral knees, right hip  . Carpal tunnel syndrome   . Complication of anesthesia   . Coronary artery disease   . Depression   . Family history of adverse reaction to anesthesia    SISTER HAS NAUSEA   . GERD (gastroesophageal reflux disease)   . HTN (hypertension)   . Hyperlipidemia   . Hypothyroidism    No thyroid  . Myocardial infarction ([place])   . PONV (postoperative nausea and vomiting)   . Thyroid cancer ([place])   . Type II diabetes mellitus ([place])    . Urinary frequency     Current Outpatient Medications on File Prior to Visit  Medication Sig Dispense Refill  . ALPRAZolam (XANAX) 0.25 MG tablet Take 0.125 mg by mouth daily as needed for anxiety.     Marland Kitchen aspirin EC 81 MG tablet Take 1 tablet (81 mg total) by mouth daily.    . busPIRone (BUSPAR) 10 MG tablet Take 10 mg by mouth 2 (two) times daily.    Marland Kitchen ezetimibe (ZETIA) 10 MG tablet Take 1 tablet (10 mg total) by mouth daily. 90 tablet 3  . icosapent Ethyl (VASCEPA) 1 g capsule Take 2 capsules (2 g total) by mouth 2 (two) times daily. 120 capsule 11  . levothyroxine (SYNTHROID, LEVOTHROID) 125 MCG tablet Take 125 mcg by mouth daily before breakfast.    . metoprolol (LOPRESSOR) 50 MG tablet Take 12.5 mg by mouth 2 (two) times daily.  3  . nitroGLYCERIN (NITROSTAT) 0.4 MG SL tablet PLACE 1 TABLET UNDER TONGUE EVERY 5 MINUTES AS NEEDED FOR CHEST PAIN 25 tablet 3  . omeprazole (PRILOSEC) 20 MG capsule Take 20 mg by mouth at bedtime.     . rosuvastatin (CRESTOR) 20 MG tablet Take 1 tablet (20 mg total) by mouth daily. 90 tablet 3   No  current facility-administered medications on file prior to visit.    Allergies  Allergen Reactions  . Sulfa Antibiotics Rash    Assessment/Plan:  1. Hyperlipidemia - Patient LDL and TG are above goal. Simvastatin already switched to rosuvastatin 10mg . Zetia continued. Patient will start Vascepa 2g BID. I have called in copay card. She will call if there is issues. We discussed diet and ways to cut carbs. Also discussed exercise. Recommended she add 2-3 days of resistance training and core exercises. Will repeat lipids in 2 months. If LDL is still high can consider PCSK9i. If TG still high will need to consider fenofibrate.   Thank you,  Ramond Dial, Pharm.D, BCPS, CPP [place]  [id number] [place], [place] [address]  Phone: [phone]; Fax: [phone]

## 2020-03-29 DIAGNOSIS — E890 Postprocedural hypothyroidism: Secondary | ICD-10-CM | POA: Diagnosis not present

## 2020-03-29 DIAGNOSIS — C73 Malignant neoplasm of thyroid gland: Secondary | ICD-10-CM | POA: Diagnosis not present

## 2020-04-02 DIAGNOSIS — F320 Major depressive disorder, single episode, mild: Secondary | ICD-10-CM | POA: Diagnosis not present

## 2020-04-05 DIAGNOSIS — E890 Postprocedural hypothyroidism: Secondary | ICD-10-CM | POA: Diagnosis not present

## 2020-04-05 DIAGNOSIS — C73 Malignant neoplasm of thyroid gland: Secondary | ICD-10-CM | POA: Diagnosis not present

## 2020-04-05 DIAGNOSIS — E1165 Type 2 diabetes mellitus with hyperglycemia: Secondary | ICD-10-CM | POA: Diagnosis not present

## 2020-04-26 DIAGNOSIS — F320 Major depressive disorder, single episode, mild: Secondary | ICD-10-CM | POA: Diagnosis not present

## 2020-05-01 DIAGNOSIS — Z23 Encounter for immunization: Secondary | ICD-10-CM | POA: Diagnosis not present

## 2020-05-21 DIAGNOSIS — F320 Major depressive disorder, single episode, mild: Secondary | ICD-10-CM | POA: Diagnosis not present

## 2020-05-25 NOTE — Progress Notes (Signed)
Patient ID: Natalie Hensley, female   DOB: 1956/08/13, 64 y.o.   MRN: [id number]      64 y.o. female with history of MI in [place] followed by a CABG on 07/27/13 with a LIMA to the LAD, SVG to the RCA, and SVG to the diagonal 1. EF was normal. Postop course was complicated by sternal wound infection treated with antibiotics and wound VAC. 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[employer]    Left TKR 4196  complicated by LLE DVT Completed 6 months anticoagulation Right TKR 08/31/18 with Dr Rhona Raider d/c on eliquis   Seen in lipid clinic and currently on crestor 20 mg , zetia 10 mg and vascepa started on 03/04/20 LDL was 83 and triglycerides 483 prior to starting   Myovue done 03/16/20 for chest pain  No ischemia soft tissue attenuation apex EF 70%  Recent lipids great per primary She has lost 11 lbs as well  ROS: Denies fever, malais, weight loss, blurry vision, decreased visual acuity, cough, sputum, SOB, hemoptysis, pleuritic pain, palpitaitons, heartburn, abdominal pain, melena, lower extremity edema, claudication, or rash.  All other systems reviewed and negative  General: There were no vitals taken for this visit. Affect appropriate Healthy:  appears stated age 84: normal Neck supple with no adenopathy JVP normal no bruits no thyromegaly Lungs clear with no wheezing and good diaphragmatic motion Heart:  S1/S2 no murmur, no rub, gallop or click PMI normal Abdomen: benighn, BS positve, no tenderness, no AAA no bruit.  No HSM or HJR Distal pulses intact with no bruits No edema Neuro non-focal Skin warm and dry Post bilateral TKR      Current Outpatient Medications  Medication Sig Dispense Refill  . ALPRAZolam (XANAX) 0.25 MG tablet Take 0.125 mg by mouth daily as needed for anxiety.     Marland Kitchen aspirin EC 81 MG tablet Take 1 tablet (81 mg total) by mouth daily.    . busPIRone (BUSPAR) 10  MG tablet Take 10 mg by mouth 2 (two) times daily.    Marland Kitchen ezetimibe (ZETIA) 10 MG tablet Take 1 tablet (10 mg total) by mouth daily. 90 tablet 3  . icosapent Ethyl (VASCEPA) 1 g capsule Take 2 capsules (2 g total) by mouth 2 (two) times daily. 120 capsule 11  . levothyroxine (SYNTHROID, LEVOTHROID) 125 MCG tablet Take 125 mcg by mouth daily before breakfast.    . metoprolol (LOPRESSOR) 50 MG tablet Take 25 mg by mouth 2 (two) times daily.   3  . nitroGLYCERIN (NITROSTAT) 0.4 MG SL tablet PLACE 1 TABLET UNDER TONGUE EVERY 5 MINUTES AS NEEDED FOR CHEST PAIN 25 tablet 3  . omeprazole (PRILOSEC) 20 MG capsule Take 20 mg by mouth at bedtime.     . rosuvastatin (CRESTOR) 20 MG tablet Take 1 tablet (20 mg total) by mouth daily. 90 tablet 3   No current facility-administered medications for this visit.    Allergies  Sulfa antibiotics  Electrocardiogram:   06/25/17 SR rate 73 LAE low voltage   Assessment and Plan  CAD/CABG:  07/27/13 CABG with LIMA to LAD, SVG to D1 and SVG RCA.  Non ischemic myovue  2018 and more recently 03/16/20  Continue asa and beta blocker    Chol:  Vascepa recently added to crestor and zetia f/u labs ordered   Thyroid:  Stable on replacement  Labs with Dr Soyla Murphy dose recently decreased  DM:  Discussed low carb diet.  Target hemoglobin A1c is 6.5 or less.  Continue current medications.  GERD:  Improved continue prilosec  Anxiety:  F/u therapist  Ok to use HPTA and other natural remedies  On Buspar now   Ortho:  Post bilateral TKR;s with improved mobility  F/U in 6 months   Jenkins Rouge

## 2020-05-28 ENCOUNTER — Other Ambulatory Visit: Payer: Federal, State, Local not specified - PPO | Admitting: *Deleted

## 2020-05-28 ENCOUNTER — Other Ambulatory Visit: Payer: Self-pay

## 2020-05-28 DIAGNOSIS — E785 Hyperlipidemia, unspecified: Secondary | ICD-10-CM | POA: Diagnosis not present

## 2020-05-28 LAB — LIPID PANEL
Chol/HDL Ratio: 3.4 ratio (ref 0.0–4.4)
Cholesterol, Total: 139 mg/dL (ref 100–199)
HDL: 41 mg/dL (ref >39)
LDL Chol Calc (NIH): 60 mg/dL (ref 0–99)
Triglycerides: 240 mg/dL — ABNORMAL HIGH (ref 0–149)
VLDL Cholesterol Cal: 38 mg/dL (ref 5–40)

## 2020-05-28 LAB — HEPATIC FUNCTION PANEL
ALT: 48 IU/L — ABNORMAL HIGH (ref 0–32)
AST: 43 IU/L — ABNORMAL HIGH (ref 0–40)
Albumin: 4.4 g/dL (ref 3.8–4.8)
Alkaline Phosphatase: 83 IU/L (ref 48–121)
Bilirubin Total: 0.4 mg/dL (ref 0.0–1.2)
Bilirubin, Direct: 0.12 mg/dL (ref 0.00–0.40)
Total Protein: 6.8 g/dL (ref 6.0–8.5)

## 2020-05-29 ENCOUNTER — Telehealth: Payer: Self-pay | Admitting: Pharmacist

## 2020-05-29 NOTE — Telephone Encounter (Signed)
Spoke with patient about her labs. She did recently re-start metformin. Better blood glucose control should help lower TG ever further. She has lost 11lb and is exercising. Will leave medications regimen the same and recheck in 3-4 months.

## 2020-05-29 NOTE — Telephone Encounter (Signed)
Called patient to discuss lab results. TG are still high but significantly improved from a few months ago when they were >400. LDL good. Minor increase in AST/ALT. Left message for patient to call back. Need to discuss diet vs adding fenofibrate.

## 2020-06-01 ENCOUNTER — Encounter: Payer: Self-pay | Admitting: Cardiovascular Disease

## 2020-06-01 ENCOUNTER — Other Ambulatory Visit: Payer: Self-pay

## 2020-06-01 ENCOUNTER — Ambulatory Visit: Payer: Federal, State, Local not specified - PPO | Admitting: Cardiovascular Disease

## 2020-06-01 VITALS — BP 144/84 | HR 68 | Ht 68.0 in | Wt 185.4 lb

## 2020-06-01 DIAGNOSIS — Z951 Presence of aortocoronary bypass graft: Secondary | ICD-10-CM

## 2020-06-01 DIAGNOSIS — E782 Mixed hyperlipidemia: Secondary | ICD-10-CM

## 2020-06-01 NOTE — Patient Instructions (Signed)
Medication Instructions:  *If you need a refill on your cardiac medications before your next appointment, please call your pharmacy*  Lab Work: If you have labs (blood work) drawn today and your tests are completely normal, you will receive your results only by: . MyChart Message (if you have MyChart) OR . A paper copy in the mail If you have any lab test that is abnormal or we need to change your treatment, we will call you to review the results.  Follow-Up: At [facility], you and your health needs are our priority.  As part of our continuing mission to provide you with exceptional heart care, we have created designated Provider Care Teams.  These Care Teams include your primary Cardiologist (physician) and Advanced Practice Providers (APPs -  Physician Assistants and Nurse Practitioners) who all work together to provide you with the care you need, when you need it.  We recommend signing up for the patient portal called "MyChart".  Sign up information is provided on this After Visit Summary.  MyChart is used to connect with patients for Virtual Visits (Telemedicine).  Patients are able to view lab/test results, encounter notes, upcoming appointments, etc.  Non-urgent messages can be sent to your provider as well.   To learn more about what you can do with MyChart, go to [email or web address].    Your next appointment:   6 month(s)  The format for your next appointment:   In Person  Provider:   You may see Dr. Nishan or one of the following Advanced Practice Providers on your designated Care Team:    Lori Gerhardt, NP  Laura Ingold, NP  Jill McDaniel, NP   

## 2020-06-19 DIAGNOSIS — F320 Major depressive disorder, single episode, mild: Secondary | ICD-10-CM | POA: Diagnosis not present

## 2020-06-29 DIAGNOSIS — E1165 Type 2 diabetes mellitus with hyperglycemia: Secondary | ICD-10-CM | POA: Diagnosis not present

## 2020-06-29 DIAGNOSIS — E890 Postprocedural hypothyroidism: Secondary | ICD-10-CM | POA: Diagnosis not present

## 2020-07-06 DIAGNOSIS — E7800 Pure hypercholesterolemia, unspecified: Secondary | ICD-10-CM | POA: Diagnosis not present

## 2020-07-06 DIAGNOSIS — E1165 Type 2 diabetes mellitus with hyperglycemia: Secondary | ICD-10-CM | POA: Diagnosis not present

## 2020-07-06 DIAGNOSIS — E890 Postprocedural hypothyroidism: Secondary | ICD-10-CM | POA: Diagnosis not present

## 2020-07-06 DIAGNOSIS — C73 Malignant neoplasm of thyroid gland: Secondary | ICD-10-CM | POA: Diagnosis not present

## 2020-07-09 DIAGNOSIS — F320 Major depressive disorder, single episode, mild: Secondary | ICD-10-CM | POA: Diagnosis not present

## 2020-07-30 DIAGNOSIS — F320 Major depressive disorder, single episode, mild: Secondary | ICD-10-CM | POA: Diagnosis not present

## 2020-08-01 DIAGNOSIS — E1165 Type 2 diabetes mellitus with hyperglycemia: Secondary | ICD-10-CM | POA: Diagnosis not present

## 2020-08-01 DIAGNOSIS — E7800 Pure hypercholesterolemia, unspecified: Secondary | ICD-10-CM | POA: Diagnosis not present

## 2020-08-23 DIAGNOSIS — F320 Major depressive disorder, single episode, mild: Secondary | ICD-10-CM | POA: Diagnosis not present

## 2020-09-06 ENCOUNTER — Other Ambulatory Visit: Payer: Self-pay | Admitting: Family Medicine

## 2020-09-06 DIAGNOSIS — Z1231 Encounter for screening mammogram for malignant neoplasm of breast: Secondary | ICD-10-CM

## 2020-09-10 DIAGNOSIS — S0512XA Contusion of eyeball and orbital tissues, left eye, initial encounter: Secondary | ICD-10-CM | POA: Diagnosis not present

## 2020-09-10 DIAGNOSIS — W19XXXA Unspecified fall, initial encounter: Secondary | ICD-10-CM | POA: Diagnosis not present

## 2020-09-10 DIAGNOSIS — S0033XA Contusion of nose, initial encounter: Secondary | ICD-10-CM | POA: Diagnosis not present

## 2020-09-10 DIAGNOSIS — S060X9A Concussion with loss of consciousness of unspecified duration, initial encounter: Secondary | ICD-10-CM | POA: Diagnosis not present

## 2020-09-18 ENCOUNTER — Telehealth: Payer: Self-pay

## 2020-09-18 DIAGNOSIS — E785 Hyperlipidemia, unspecified: Secondary | ICD-10-CM

## 2020-09-18 NOTE — Telephone Encounter (Signed)
Called and lmomed the pt to scheduled lipid and lft labs and ordered them as well

## 2020-09-18 NOTE — Telephone Encounter (Signed)
-----   Message from Ramond Dial, [place] sent at 09/18/2020  7:03 AM EST -----  ----- Message ----- From: Ramond Dial, RPH-CPP Sent: 09/18/2020 To: Ramond Dial, RPH-CPP  Set up lipids

## 2020-09-18 NOTE — Telephone Encounter (Signed)
Will check labs a few days before her apt with Dr. Johnsie Cancel. She will schedule that apt and then call me and we will schedule lab visit

## 2020-09-20 DIAGNOSIS — F320 Major depressive disorder, single episode, mild: Secondary | ICD-10-CM | POA: Diagnosis not present

## 2020-10-15 DIAGNOSIS — F320 Major depressive disorder, single episode, mild: Secondary | ICD-10-CM | POA: Diagnosis not present

## 2020-10-16 DIAGNOSIS — E1165 Type 2 diabetes mellitus with hyperglycemia: Secondary | ICD-10-CM | POA: Diagnosis not present

## 2020-10-16 DIAGNOSIS — E890 Postprocedural hypothyroidism: Secondary | ICD-10-CM | POA: Diagnosis not present

## 2020-10-22 DIAGNOSIS — E7800 Pure hypercholesterolemia, unspecified: Secondary | ICD-10-CM | POA: Diagnosis not present

## 2020-10-22 DIAGNOSIS — E890 Postprocedural hypothyroidism: Secondary | ICD-10-CM | POA: Diagnosis not present

## 2020-10-22 DIAGNOSIS — C73 Malignant neoplasm of thyroid gland: Secondary | ICD-10-CM | POA: Diagnosis not present

## 2020-10-22 DIAGNOSIS — E1165 Type 2 diabetes mellitus with hyperglycemia: Secondary | ICD-10-CM | POA: Diagnosis not present

## 2020-10-30 ENCOUNTER — Ambulatory Visit
Admission: RE | Admit: 2020-10-30 | Discharge: 2020-10-30 | Disposition: A | Discharge status: Home / Self Care | Payer: Federal, State, Local not specified - PPO | Source: Ambulatory Visit | Attending: Family Medicine | Admitting: Family Medicine

## 2020-10-30 ENCOUNTER — Other Ambulatory Visit: Payer: Self-pay

## 2020-10-30 DIAGNOSIS — Z1231 Encounter for screening mammogram for malignant neoplasm of breast: Secondary | ICD-10-CM | POA: Diagnosis not present

## 2020-11-05 DIAGNOSIS — F320 Major depressive disorder, single episode, mild: Secondary | ICD-10-CM | POA: Diagnosis not present

## 2020-11-26 ENCOUNTER — Other Ambulatory Visit: Payer: Federal, State, Local not specified - PPO

## 2020-11-27 NOTE — Progress Notes (Signed)
Patient ID: Natalie Hensley, female   DOB: 24-Nov-1955, 65 y.o.   MRN: [id number]      65 y.o. female with history of MI in [place] followed by a CABG on 07/27/13 with a LIMA to the LAD, SVG to the RCA, and SVG to the diagonal 1. EF was normal. Postop course was complicated by sternal wound infection treated with antibiotics and wound VAC. 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[employer]    Left TKR 2841  complicated by LLE DVT Completed 6 months anticoagulation Right TKR 08/31/18 with Dr Rhona Raider d/c on eliquis   Seen in lipid clinic and currently on crestor 20 mg , zetia 10 mg and vascepa started on 03/04/20 LDL was 83 and triglycerides 483 prior to starting   Recent lipids 05/28/20  LDL 60 Triglycerides 240   Myovue done 03/16/20 for chest pain  No ischemia soft tissue attenuation apex EF 70%  Has really done well with diet and exercise weight down A1c much better as are lipids on Rx  ROS: Denies fever, malais, weight loss, blurry vision, decreased visual acuity, cough, sputum, SOB, hemoptysis, pleuritic pain, palpitaitons, heartburn, abdominal pain, melena, lower extremity edema, claudication, or rash.  All other systems reviewed and negative  General: BP 138/88   Pulse (!) 58   Ht 5\' 8"  (1.727 m)   Wt 78.5 kg   SpO2 98%   BMI 26.30 kg/m  Affect appropriate Healthy:  appears stated age 65: normal Neck supple with no adenopathy JVP normal no bruits no thyromegaly Lungs clear with no wheezing and good diaphragmatic motion Heart:  S1/S2 no murmur, no rub, gallop or click PMI normal Abdomen: benighn, BS positve, no tenderness, no AAA no bruit.  No HSM or HJR Distal pulses intact with no bruits No edema Neuro non-focal Skin warm and dry Post bilateral TKR      Current Outpatient Medications  Medication Sig Dispense Refill  . ALPRAZolam (XANAX) 0.25 MG tablet Take 0.125 mg by  mouth daily as needed for anxiety.     Marland Kitchen aspirin EC 81 MG tablet Take 1 tablet (81 mg total) by mouth daily.    . busPIRone (BUSPAR) 10 MG tablet Take 10 mg by mouth 2 (two) times daily.    Marland Kitchen ezetimibe (ZETIA) 10 MG tablet Take 1 tablet (10 mg total) by mouth daily. 90 tablet 3  . icosapent Ethyl (VASCEPA) 1 g capsule Take 2 capsules (2 g total) by mouth 2 (two) times daily. 120 capsule 11  . levothyroxine (SYNTHROID) 112 MCG tablet Take 112 mcg by mouth every morning.    Marland Kitchen levothyroxine (SYNTHROID, LEVOTHROID) 125 MCG tablet Take 125 mcg by mouth daily before breakfast.    . metoprolol (LOPRESSOR) 50 MG tablet Take 25 mg by mouth 2 (two) times daily.   3  . nitroGLYCERIN (NITROSTAT) 0.4 MG SL tablet PLACE 1 TABLET UNDER TONGUE EVERY 5 MINUTES AS NEEDED FOR CHEST PAIN 25 tablet 3  . omeprazole (PRILOSEC) 20 MG capsule Take 20 mg by mouth at bedtime.     . rosuvastatin (CRESTOR) 20 MG tablet Take 1 tablet (20 mg total) by mouth daily. 90 tablet 3  . XIGDUO XR 10-500 MG TB24 Take 1 tablet by mouth daily.     No current facility-administered medications for this visit.    Allergies  Sulfamethoxazole-trimethoprim and Sulfa antibiotics  Electrocardiogram:   06/25/17 SR rate 73  LAE low voltage 12/06/2020 NSR rate 58 normal   Assessment and Plan  CAD/CABG:  07/27/13 CABG with LIMA to LAD, SVG to D1 and SVG RCA.  Non ischemic myovue  2018 and more recently 03/16/20  Continue asa and beta blocker    Chol:  Vascepa recently added to crestor and zetia f/u labs ordered   Thyroid:  Stable on replacement  Labs with Dr Soyla Murphy dose recently decreased   DM:  Discussed low carb diet.  Target hemoglobin A1c is 6.5 or less.  Continue current medications.  GERD:  Improved continue prilosec  Anxiety:  F/u therapist  Ok to use HPTA and other natural remedies  On Buspar now   Ortho:  Post bilateral TKR;s with improved mobility  F/U in a year   [employer]

## 2020-11-28 ENCOUNTER — Other Ambulatory Visit: Payer: Federal, State, Local not specified - PPO

## 2020-11-29 ENCOUNTER — Other Ambulatory Visit: Payer: Federal, State, Local not specified - PPO | Admitting: *Deleted

## 2020-11-29 ENCOUNTER — Other Ambulatory Visit: Payer: Self-pay

## 2020-11-29 DIAGNOSIS — E785 Hyperlipidemia, unspecified: Secondary | ICD-10-CM

## 2020-11-29 LAB — HEPATIC FUNCTION PANEL
ALT: 29 IU/L (ref 0–32)
AST: 22 IU/L (ref 0–40)
Albumin: 4.3 g/dL (ref 3.8–4.8)
Alkaline Phosphatase: 68 IU/L (ref 44–121)
Bilirubin Total: 0.3 mg/dL (ref 0.0–1.2)
Bilirubin, Direct: 0.11 mg/dL (ref 0.00–0.40)
Total Protein: 6.4 g/dL (ref 6.0–8.5)

## 2020-11-29 LAB — LIPID PANEL
Chol/HDL Ratio: 2.7 ratio (ref 0.0–4.4)
Cholesterol, Total: 125 mg/dL (ref 100–199)
HDL: 46 mg/dL (ref >39)
LDL Chol Calc (NIH): 49 mg/dL (ref 0–99)
Triglycerides: 183 mg/dL — ABNORMAL HIGH (ref 0–149)
VLDL Cholesterol Cal: 30 mg/dL (ref 5–40)

## 2020-12-06 ENCOUNTER — Other Ambulatory Visit: Payer: Self-pay

## 2020-12-06 ENCOUNTER — Encounter: Payer: Self-pay | Admitting: Cardiovascular Disease

## 2020-12-06 ENCOUNTER — Ambulatory Visit: Payer: Federal, State, Local not specified - PPO | Admitting: Cardiovascular Disease

## 2020-12-06 VITALS — BP 138/88 | HR 58 | Ht 68.0 in | Wt 173.0 lb

## 2020-12-06 DIAGNOSIS — Z951 Presence of aortocoronary bypass graft: Secondary | ICD-10-CM

## 2020-12-06 DIAGNOSIS — E782 Mixed hyperlipidemia: Secondary | ICD-10-CM | POA: Diagnosis not present

## 2020-12-06 NOTE — Patient Instructions (Signed)

## 2020-12-07 DIAGNOSIS — Z0000 Encounter for general adult medical examination without abnormal findings: Secondary | ICD-10-CM | POA: Diagnosis not present

## 2020-12-07 DIAGNOSIS — Z23 Encounter for immunization: Secondary | ICD-10-CM | POA: Diagnosis not present

## 2020-12-07 DIAGNOSIS — R399 Unspecified symptoms and signs involving the genitourinary system: Secondary | ICD-10-CM | POA: Diagnosis not present

## 2020-12-27 DIAGNOSIS — D485 Neoplasm of uncertain behavior of skin: Secondary | ICD-10-CM | POA: Diagnosis not present

## 2020-12-27 DIAGNOSIS — Z85828 Personal history of other malignant neoplasm of skin: Secondary | ICD-10-CM | POA: Diagnosis not present

## 2020-12-27 DIAGNOSIS — C4441 Basal cell carcinoma of skin of scalp and neck: Secondary | ICD-10-CM | POA: Diagnosis not present

## 2021-01-17 DIAGNOSIS — F320 Major depressive disorder, single episode, mild: Secondary | ICD-10-CM | POA: Diagnosis not present

## 2021-02-07 DIAGNOSIS — F320 Major depressive disorder, single episode, mild: Secondary | ICD-10-CM | POA: Diagnosis not present

## 2021-02-15 ENCOUNTER — Other Ambulatory Visit: Payer: Self-pay | Admitting: Cardiovascular Disease

## 2021-02-28 DIAGNOSIS — F320 Major depressive disorder, single episode, mild: Secondary | ICD-10-CM | POA: Diagnosis not present

## 2021-03-26 ENCOUNTER — Other Ambulatory Visit: Payer: Self-pay | Admitting: Cardiovascular Disease

## 2021-03-28 DIAGNOSIS — F320 Major depressive disorder, single episode, mild: Secondary | ICD-10-CM | POA: Diagnosis not present

## 2021-04-04 DIAGNOSIS — C73 Malignant neoplasm of thyroid gland: Secondary | ICD-10-CM | POA: Diagnosis not present

## 2021-04-04 DIAGNOSIS — E890 Postprocedural hypothyroidism: Secondary | ICD-10-CM | POA: Diagnosis not present

## 2021-04-04 DIAGNOSIS — E1165 Type 2 diabetes mellitus with hyperglycemia: Secondary | ICD-10-CM | POA: Diagnosis not present

## 2021-04-04 DIAGNOSIS — E7800 Pure hypercholesterolemia, unspecified: Secondary | ICD-10-CM | POA: Diagnosis not present

## 2021-04-22 ENCOUNTER — Other Ambulatory Visit: Payer: Self-pay | Admitting: Cardiovascular Disease

## 2021-05-20 DIAGNOSIS — F320 Major depressive disorder, single episode, mild: Secondary | ICD-10-CM | POA: Diagnosis not present

## 2021-06-20 DIAGNOSIS — F320 Major depressive disorder, single episode, mild: Secondary | ICD-10-CM | POA: Diagnosis not present

## 2021-07-23 DIAGNOSIS — F320 Major depressive disorder, single episode, mild: Secondary | ICD-10-CM | POA: Diagnosis not present

## 2021-07-29 DIAGNOSIS — E890 Postprocedural hypothyroidism: Secondary | ICD-10-CM | POA: Diagnosis not present

## 2021-07-29 DIAGNOSIS — C73 Malignant neoplasm of thyroid gland: Secondary | ICD-10-CM | POA: Diagnosis not present

## 2021-07-29 DIAGNOSIS — E7800 Pure hypercholesterolemia, unspecified: Secondary | ICD-10-CM | POA: Diagnosis not present

## 2021-07-29 DIAGNOSIS — E1165 Type 2 diabetes mellitus with hyperglycemia: Secondary | ICD-10-CM | POA: Diagnosis not present

## 2021-08-05 DIAGNOSIS — E890 Postprocedural hypothyroidism: Secondary | ICD-10-CM | POA: Diagnosis not present

## 2021-08-05 DIAGNOSIS — C73 Malignant neoplasm of thyroid gland: Secondary | ICD-10-CM | POA: Diagnosis not present

## 2021-08-05 DIAGNOSIS — E1165 Type 2 diabetes mellitus with hyperglycemia: Secondary | ICD-10-CM | POA: Diagnosis not present

## 2021-08-05 DIAGNOSIS — E7800 Pure hypercholesterolemia, unspecified: Secondary | ICD-10-CM | POA: Diagnosis not present

## 2021-08-29 DIAGNOSIS — F320 Major depressive disorder, single episode, mild: Secondary | ICD-10-CM | POA: Diagnosis not present

## 2021-09-23 DIAGNOSIS — F320 Major depressive disorder, single episode, mild: Secondary | ICD-10-CM | POA: Diagnosis not present

## 2021-09-25 DIAGNOSIS — E7800 Pure hypercholesterolemia, unspecified: Secondary | ICD-10-CM | POA: Diagnosis not present

## 2021-09-25 DIAGNOSIS — E1165 Type 2 diabetes mellitus with hyperglycemia: Secondary | ICD-10-CM | POA: Diagnosis not present

## 2021-09-25 DIAGNOSIS — C73 Malignant neoplasm of thyroid gland: Secondary | ICD-10-CM | POA: Diagnosis not present

## 2021-09-25 DIAGNOSIS — E890 Postprocedural hypothyroidism: Secondary | ICD-10-CM | POA: Diagnosis not present

## 2021-09-26 DIAGNOSIS — F320 Major depressive disorder, single episode, mild: Secondary | ICD-10-CM | POA: Diagnosis not present

## 2021-10-10 DIAGNOSIS — F320 Major depressive disorder, single episode, mild: Secondary | ICD-10-CM | POA: Diagnosis not present

## 2021-10-27 ENCOUNTER — Other Ambulatory Visit: Payer: Self-pay | Admitting: Cardiovascular Disease

## 2021-10-28 DIAGNOSIS — F320 Major depressive disorder, single episode, mild: Secondary | ICD-10-CM | POA: Diagnosis not present

## 2021-11-28 DIAGNOSIS — F320 Major depressive disorder, single episode, mild: Secondary | ICD-10-CM | POA: Diagnosis not present

## 2021-12-26 DIAGNOSIS — F320 Major depressive disorder, single episode, mild: Secondary | ICD-10-CM | POA: Diagnosis not present

## 2021-12-30 DIAGNOSIS — Z0000 Encounter for general adult medical examination without abnormal findings: Secondary | ICD-10-CM | POA: Diagnosis not present

## 2021-12-30 DIAGNOSIS — Z23 Encounter for immunization: Secondary | ICD-10-CM | POA: Diagnosis not present

## 2022-01-02 ENCOUNTER — Other Ambulatory Visit: Payer: Self-pay | Admitting: Family Medicine

## 2022-01-02 DIAGNOSIS — Z13820 Encounter for screening for osteoporosis: Secondary | ICD-10-CM

## 2022-01-17 ENCOUNTER — Other Ambulatory Visit: Payer: Self-pay | Admitting: Family Medicine

## 2022-01-17 DIAGNOSIS — Z1231 Encounter for screening mammogram for malignant neoplasm of breast: Secondary | ICD-10-CM

## 2022-01-21 DIAGNOSIS — F320 Major depressive disorder, single episode, mild: Secondary | ICD-10-CM | POA: Diagnosis not present

## 2022-01-24 ENCOUNTER — Other Ambulatory Visit: Payer: Self-pay

## 2022-01-24 ENCOUNTER — Ambulatory Visit
Admission: RE | Admit: 2022-01-24 | Discharge: 2022-01-24 | Disposition: A | Discharge status: Home / Self Care | Payer: Federal, State, Local not specified - PPO | Source: Ambulatory Visit | Attending: Family Medicine | Admitting: Family Medicine

## 2022-01-24 DIAGNOSIS — Z1231 Encounter for screening mammogram for malignant neoplasm of breast: Secondary | ICD-10-CM

## 2022-01-28 ENCOUNTER — Other Ambulatory Visit: Payer: Self-pay | Admitting: Cardiovascular Disease

## 2022-02-09 ENCOUNTER — Other Ambulatory Visit: Payer: Self-pay | Admitting: Cardiovascular Disease

## 2022-02-14 ENCOUNTER — Other Ambulatory Visit: Payer: Self-pay | Admitting: Cardiovascular Disease

## 2022-02-19 ENCOUNTER — Other Ambulatory Visit: Payer: Self-pay | Admitting: Cardiovascular Disease

## 2022-02-25 DIAGNOSIS — F320 Major depressive disorder, single episode, mild: Secondary | ICD-10-CM | POA: Diagnosis not present

## 2022-03-11 ENCOUNTER — Other Ambulatory Visit: Payer: Self-pay | Admitting: Cardiovascular Disease

## 2022-03-13 ENCOUNTER — Other Ambulatory Visit: Payer: Self-pay | Admitting: Cardiovascular Disease

## 2022-03-13 MED ORDER — EZETIMIBE 10 MG PO TABS: 10.0000 mg | ORAL_TABLET | Freq: Every day | ORAL | 1 refills | Status: DC

## 2022-03-21 DIAGNOSIS — E1165 Type 2 diabetes mellitus with hyperglycemia: Secondary | ICD-10-CM | POA: Diagnosis not present

## 2022-03-21 DIAGNOSIS — E890 Postprocedural hypothyroidism: Secondary | ICD-10-CM | POA: Diagnosis not present

## 2022-03-21 DIAGNOSIS — C73 Malignant neoplasm of thyroid gland: Secondary | ICD-10-CM | POA: Diagnosis not present

## 2022-03-21 DIAGNOSIS — E7800 Pure hypercholesterolemia, unspecified: Secondary | ICD-10-CM | POA: Diagnosis not present

## 2022-03-23 NOTE — Progress Notes (Signed)
Patient ID: Natalie Hensley, female   DOB: 03/07/1956, 66 y.o.   MRN: [id number]  ? ? ? ? 66 y.o. female with history of MI in [place] followed by a CABG on 07/27/13 with a LIMA to the LAD, SVG to the RCA, and SVG to the diagonal 1. EF was normal. Postop course was complicated by sternal wound infection treated with antibiotics and wound VAC. 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 ?Seeing therapist for anxiety. Better on meds  Stress as a Teaching laboratory technician at [employer]   ? ?Left TKR 1607  complicated by LLE DVT Completed 6 months anticoagulation ?Right TKR 08/31/18 with Dr Rhona Raider d/c on eliquis  ? ?Seen in lipid clinic and currently on crestor 20 mg , zetia 10 mg and vascepa started on 03/04/20 LDL was 83 and triglycerides 483 prior to starting  ? ?Myovue done 03/16/20 for chest pain  ?No ischemia soft tissue attenuation apex ?EF 70% ? ?Has really done well with diet and exercise weight down A1c much better as are lipids on Rx ? ?11/29/20 LDL 49 Triglycerides 183  ? ? ? ?ROS: Denies fever, malais, weight loss, blurry vision, decreased visual acuity, cough, sputum, SOB, hemoptysis, pleuritic pain, palpitaitons, heartburn, abdominal pain, melena, lower extremity edema, claudication, or rash.  All other systems reviewed and negative ? ?General: ?There were no vitals taken for this visit. ?Affect appropriate ?Healthy:  appears stated age ?HEENT: normal ?Neck supple with no adenopathy ?JVP normal no bruits no thyromegaly ?Lungs clear with no wheezing and good diaphragmatic motion ?Heart:  S1/S2 no murmur, no rub, gallop or click ?PMI normal ?Abdomen: benighn, BS positve, no tenderness, no AAA ?no bruit.  No HSM or HJR ?Distal pulses intact with no bruits ?No edema ?Neuro non-focal ?Skin warm and dry ?Post bilateral TKR  ? ? ? ? ?Current Outpatient Medications  ?Medication Sig Dispense Refill  ? ALPRAZolam (XANAX) 0.25 MG tablet Take 0.125 mg by mouth daily as needed for anxiety.     ? aspirin EC 81 MG tablet  Take 1 tablet (81 mg total) by mouth daily.    ? busPIRone (BUSPAR) 10 MG tablet Take 10 mg by mouth 2 (two) times daily.    ? ezetimibe (ZETIA) 10 MG tablet Take 1 tablet (10 mg total) by mouth daily. 30 tablet 1  ? icosapent Ethyl (VASCEPA) 1 g capsule TAKE 2 CAPSULES (2 G TOTAL) BY MOUTH 2 (TWO) TIMES DAILY. 120 capsule 11  ? levothyroxine (SYNTHROID) 112 MCG tablet Take 112 mcg by mouth every morning.    ? levothyroxine (SYNTHROID, LEVOTHROID) 125 MCG tablet Take 125 mcg by mouth daily before breakfast.    ? metoprolol (LOPRESSOR) 50 MG tablet Take 25 mg by mouth 2 (two) times daily.   3  ? nitroGLYCERIN (NITROSTAT) 0.4 MG SL tablet PLACE 1 TABLET UNDER TONGUE EVERY 5 MINUTES AS NEEDED FOR CHEST PAIN 25 tablet 3  ? omeprazole (PRILOSEC) 20 MG capsule Take 20 mg by mouth at bedtime.     ? rosuvastatin (CRESTOR) 20 MG tablet TAKE 1 TABLET BY MOUTH EVERY DAY 30 tablet 0  ? XIGDUO XR 10-500 MG TB24 Take 1 tablet by mouth daily.    ? ?No current facility-administered medications for this visit.  ? ? ?Allergies ? ?Sulfamethoxazole-trimethoprim and Sulfa antibiotics ? ?Electrocardiogram:   06/25/17 SR rate 73 LAE low voltage 03/23/2022 NSR rate 58 normal 03/24/2022 SR rate 74 artifact poor R wave progression  ? ?Assessment and Plan ? ?CAD/CABG:  07/27/13 CABG with LIMA to LAD, SVG to D1 and SVG RCA.  Non ischemic myovue  2018 and more recently 03/16/20  Continue asa and beta blocker   ? ?Chol:  Vascepa  added to crestor and zetia f/u labs ordered  ? ?Thyroid:  Stable on replacement  Labs with Dr Soyla Murphy dose recently decreased  ? ?DM:  Discussed low carb diet.  Target hemoglobin A1c is 6.5 or less.  Continue current medications. ? ?GERD:  Improved continue prilosec ? ?Anxiety:  F/u therapist  Ok to use HPTA and other natural remedies  On Buspar now  ? ?Ortho:  Post bilateral TKR;s with improved mobility ? ?F/U in a year  ? ?Lipid / Liver  ? ?Jenkins Rouge ? ? ?

## 2022-03-24 ENCOUNTER — Encounter: Payer: Self-pay | Admitting: Cardiovascular Disease

## 2022-03-24 ENCOUNTER — Ambulatory Visit: Payer: Federal, State, Local not specified - PPO | Admitting: Cardiovascular Disease

## 2022-03-24 VITALS — BP 124/80 | HR 75 | Ht 68.0 in | Wt 170.0 lb

## 2022-03-24 DIAGNOSIS — E782 Mixed hyperlipidemia: Secondary | ICD-10-CM

## 2022-03-24 DIAGNOSIS — E785 Hyperlipidemia, unspecified: Secondary | ICD-10-CM | POA: Diagnosis not present

## 2022-03-24 DIAGNOSIS — Z951 Presence of aortocoronary bypass graft: Secondary | ICD-10-CM

## 2022-03-24 MED ORDER — NITROGLYCERIN 0.4 MG SL SUBL: SUBLINGUAL_TABLET | SUBLINGUAL | 3 refills | Status: DC

## 2022-03-24 NOTE — Patient Instructions (Signed)

## 2022-03-25 DIAGNOSIS — F320 Major depressive disorder, single episode, mild: Secondary | ICD-10-CM | POA: Diagnosis not present

## 2022-03-29 ENCOUNTER — Other Ambulatory Visit: Payer: Self-pay | Admitting: Cardiovascular Disease

## 2022-04-04 ENCOUNTER — Other Ambulatory Visit: Payer: Self-pay | Admitting: Cardiovascular Disease

## 2022-04-06 ENCOUNTER — Other Ambulatory Visit: Payer: Self-pay | Admitting: Cardiovascular Disease

## 2022-04-21 ENCOUNTER — Other Ambulatory Visit: Payer: Self-pay | Admitting: Cardiovascular Disease

## 2022-05-01 DIAGNOSIS — F320 Major depressive disorder, single episode, mild: Secondary | ICD-10-CM | POA: Diagnosis not present

## 2022-05-29 DIAGNOSIS — F320 Major depressive disorder, single episode, mild: Secondary | ICD-10-CM | POA: Diagnosis not present

## 2022-06-26 DIAGNOSIS — F320 Major depressive disorder, single episode, mild: Secondary | ICD-10-CM | POA: Diagnosis not present

## 2022-06-30 ENCOUNTER — Ambulatory Visit
Admission: RE | Admit: 2022-06-30 | Discharge: 2022-06-30 | Disposition: A | Discharge status: Home / Self Care | Payer: Federal, State, Local not specified - PPO | Source: Ambulatory Visit | Attending: Family Medicine | Admitting: Family Medicine

## 2022-06-30 DIAGNOSIS — Z780 Asymptomatic menopausal state: Secondary | ICD-10-CM | POA: Diagnosis not present

## 2022-06-30 DIAGNOSIS — Z13820 Encounter for screening for osteoporosis: Secondary | ICD-10-CM

## 2022-06-30 DIAGNOSIS — M8589 Other specified disorders of bone density and structure, multiple sites: Secondary | ICD-10-CM | POA: Diagnosis not present

## 2022-08-01 DIAGNOSIS — F320 Major depressive disorder, single episode, mild: Secondary | ICD-10-CM | POA: Diagnosis not present

## 2022-08-28 DIAGNOSIS — F320 Major depressive disorder, single episode, mild: Secondary | ICD-10-CM | POA: Diagnosis not present

## 2022-09-25 DIAGNOSIS — F320 Major depressive disorder, single episode, mild: Secondary | ICD-10-CM | POA: Diagnosis not present

## 2022-10-23 DIAGNOSIS — F320 Major depressive disorder, single episode, mild: Secondary | ICD-10-CM | POA: Diagnosis not present

## 2022-11-21 DIAGNOSIS — E890 Postprocedural hypothyroidism: Secondary | ICD-10-CM | POA: Diagnosis not present

## 2022-11-21 DIAGNOSIS — E1165 Type 2 diabetes mellitus with hyperglycemia: Secondary | ICD-10-CM | POA: Diagnosis not present

## 2022-11-21 DIAGNOSIS — C73 Malignant neoplasm of thyroid gland: Secondary | ICD-10-CM | POA: Diagnosis not present

## 2022-11-24 DIAGNOSIS — F320 Major depressive disorder, single episode, mild: Secondary | ICD-10-CM | POA: Diagnosis not present

## 2022-11-28 DIAGNOSIS — E890 Postprocedural hypothyroidism: Secondary | ICD-10-CM | POA: Diagnosis not present

## 2022-11-28 DIAGNOSIS — C73 Malignant neoplasm of thyroid gland: Secondary | ICD-10-CM | POA: Diagnosis not present

## 2022-11-28 DIAGNOSIS — E1165 Type 2 diabetes mellitus with hyperglycemia: Secondary | ICD-10-CM | POA: Diagnosis not present

## 2022-11-28 DIAGNOSIS — E7800 Pure hypercholesterolemia, unspecified: Secondary | ICD-10-CM | POA: Diagnosis not present

## 2022-12-23 DIAGNOSIS — F320 Major depressive disorder, single episode, mild: Secondary | ICD-10-CM | POA: Diagnosis not present

## 2023-01-05 DIAGNOSIS — E782 Mixed hyperlipidemia: Secondary | ICD-10-CM | POA: Diagnosis not present

## 2023-01-05 DIAGNOSIS — F411 Generalized anxiety disorder: Secondary | ICD-10-CM | POA: Diagnosis not present

## 2023-01-05 DIAGNOSIS — Z1159 Encounter for screening for other viral diseases: Secondary | ICD-10-CM | POA: Diagnosis not present

## 2023-01-05 DIAGNOSIS — I10 Essential (primary) hypertension: Secondary | ICD-10-CM | POA: Diagnosis not present

## 2023-01-05 DIAGNOSIS — Z23 Encounter for immunization: Secondary | ICD-10-CM | POA: Diagnosis not present

## 2023-01-05 DIAGNOSIS — I25810 Atherosclerosis of coronary artery bypass graft(s) without angina pectoris: Secondary | ICD-10-CM | POA: Diagnosis not present

## 2023-01-05 DIAGNOSIS — Z0000 Encounter for general adult medical examination without abnormal findings: Secondary | ICD-10-CM | POA: Diagnosis not present

## 2023-01-05 DIAGNOSIS — E559 Vitamin D deficiency, unspecified: Secondary | ICD-10-CM | POA: Diagnosis not present

## 2023-01-05 DIAGNOSIS — E113291 Type 2 diabetes mellitus with mild nonproliferative diabetic retinopathy without macular edema, right eye: Secondary | ICD-10-CM | POA: Diagnosis not present

## 2023-01-20 DIAGNOSIS — F320 Major depressive disorder, single episode, mild: Secondary | ICD-10-CM | POA: Diagnosis not present

## 2023-02-11 DIAGNOSIS — F320 Major depressive disorder, single episode, mild: Secondary | ICD-10-CM | POA: Diagnosis not present

## 2023-03-11 DIAGNOSIS — F320 Major depressive disorder, single episode, mild: Secondary | ICD-10-CM | POA: Diagnosis not present

## 2023-04-01 DIAGNOSIS — E7800 Pure hypercholesterolemia, unspecified: Secondary | ICD-10-CM | POA: Diagnosis not present

## 2023-04-01 DIAGNOSIS — E890 Postprocedural hypothyroidism: Secondary | ICD-10-CM | POA: Diagnosis not present

## 2023-04-01 DIAGNOSIS — E1165 Type 2 diabetes mellitus with hyperglycemia: Secondary | ICD-10-CM | POA: Diagnosis not present

## 2023-04-01 DIAGNOSIS — C73 Malignant neoplasm of thyroid gland: Secondary | ICD-10-CM | POA: Diagnosis not present

## 2023-04-07 DIAGNOSIS — F320 Major depressive disorder, single episode, mild: Secondary | ICD-10-CM | POA: Diagnosis not present

## 2023-04-15 ENCOUNTER — Other Ambulatory Visit: Payer: Self-pay | Admitting: Cardiovascular Disease

## 2023-05-06 DIAGNOSIS — F320 Major depressive disorder, single episode, mild: Secondary | ICD-10-CM | POA: Diagnosis not present

## 2023-05-07 ENCOUNTER — Ambulatory Visit: Payer: Federal, State, Local not specified - PPO | Admitting: Cardiovascular Disease

## 2023-06-03 DIAGNOSIS — F320 Major depressive disorder, single episode, mild: Secondary | ICD-10-CM | POA: Diagnosis not present

## 2023-07-07 DIAGNOSIS — F320 Major depressive disorder, single episode, mild: Secondary | ICD-10-CM | POA: Diagnosis not present

## 2023-07-08 ENCOUNTER — Other Ambulatory Visit: Payer: Self-pay | Admitting: Cardiovascular Disease

## 2023-08-03 DIAGNOSIS — E890 Postprocedural hypothyroidism: Secondary | ICD-10-CM | POA: Diagnosis not present

## 2023-08-03 DIAGNOSIS — E1165 Type 2 diabetes mellitus with hyperglycemia: Secondary | ICD-10-CM | POA: Diagnosis not present

## 2023-08-03 DIAGNOSIS — C73 Malignant neoplasm of thyroid gland: Secondary | ICD-10-CM | POA: Diagnosis not present

## 2023-08-05 DIAGNOSIS — F320 Major depressive disorder, single episode, mild: Secondary | ICD-10-CM | POA: Diagnosis not present

## 2023-08-10 DIAGNOSIS — F320 Major depressive disorder, single episode, mild: Secondary | ICD-10-CM | POA: Diagnosis not present

## 2023-08-11 DIAGNOSIS — E1165 Type 2 diabetes mellitus with hyperglycemia: Secondary | ICD-10-CM | POA: Diagnosis not present

## 2023-08-11 DIAGNOSIS — C73 Malignant neoplasm of thyroid gland: Secondary | ICD-10-CM | POA: Diagnosis not present

## 2023-08-11 DIAGNOSIS — I2510 Atherosclerotic heart disease of native coronary artery without angina pectoris: Secondary | ICD-10-CM | POA: Diagnosis not present

## 2023-08-11 DIAGNOSIS — E890 Postprocedural hypothyroidism: Secondary | ICD-10-CM | POA: Diagnosis not present

## 2023-08-11 DIAGNOSIS — Z23 Encounter for immunization: Secondary | ICD-10-CM | POA: Diagnosis not present

## 2023-09-22 NOTE — Progress Notes (Signed)
Patient ID: Natalie Hensley, female   DOB: 09/05/1956, 67 y.o.   MRN: [id number]      67 y.o. female with history of MI in [place] followed by a CABG on 07/27/13 with a LIMA to the LAD, SVG to the RCA, and SVG to the diagonal 1. EF was normal. Postop course was complicated by sternal wound infection treated with antibiotics and wound VAC. 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[employer]    Left TKR 2018  complicated by LLE DVT Completed 6 months anticoagulation Right TKR 08/31/18 with Dr Jerl Santos d/c on eliquis   Seen in lipid clinic and currently on crestor 20 mg , zetia 10 mg and vascepa started on 03/04/20 LDL was 83 and triglycerides 483 prior to starting   Myovue done 03/16/20 for chest pain  No ischemia soft tissue attenuation apex EF 70%  Has really done well with diet and exercise weight down A1c much better as are lipids on Rx  She does get rare SSCP not needing nitroglycerin and more exertional dyspnea      ROS: Denies fever, malais, weight loss, blurry vision, decreased visual acuity, cough, sputum, SOB, hemoptysis, pleuritic pain, palpitaitons, heartburn, abdominal pain, melena, lower extremity edema, claudication, or rash.  All other systems reviewed and negative  General: BP 118/74 (BP Location: Left Arm, Patient Position: Sitting, Cuff Size: Normal)   Pulse 74   Resp 16   Ht 5\' 8"  (1.727 m)   Wt 172 lb 3.2 oz (78.1 kg)   SpO2 97%   BMI 26.18 kg/m  Affect appropriate Healthy:  appears stated age HEENT: normal Neck supple with no adenopathy JVP normal no bruits no thyromegaly Lungs clear with no wheezing and good diaphragmatic motion Heart:  S1/S2 no murmur, no rub, gallop or click PMI normal Abdomen: benighn, BS positve, no tenderness, no AAA no bruit.  No HSM or HJR Distal pulses intact with no bruits No edema Neuro non-focal Skin warm and dry Post bilateral TKR       Current Outpatient Medications  Medication Sig Dispense Refill   ALPRAZolam (XANAX) 0.25 MG tablet Take 0.125 mg by mouth daily as needed for anxiety.      aspirin EC 81 MG tablet Take 1 tablet (81 mg total) by mouth daily.     busPIRone (BUSPAR) 10 MG tablet Take 10 mg by mouth 2 (two) times daily.     ezetimibe (ZETIA) 10 MG tablet TAKE 1 TABLET BY MOUTH EVERY DAY 30 tablet 0   icosapent Ethyl (VASCEPA) 1 g capsule TAKE 2 CAPSULES BY MOUTH TWICE A DAY 120 capsule 2   levothyroxine (SYNTHROID) 112 MCG tablet Take 112 mcg by mouth every morning.     metFORMIN (GLUCOPHAGE-XR) 500 MG 24 hr tablet Take 500 mg by mouth daily.     metoprolol (LOPRESSOR) 50 MG tablet Take 25 mg by mouth 2 (two) times daily.   3   nitroGLYCERIN (NITROSTAT) 0.4 MG SL tablet PLACE 1 TABLET UNDER TONGUE EVERY 5 MINUTES AS NEEDED FOR CHEST PAIN 25 tablet 3   omeprazole (PRILOSEC) 20 MG capsule Take 20 mg by mouth at bedtime.      OZEMPIC, 0.25 OR 0.5 MG/DOSE, 2 MG/3ML SOPN Inject 0.5 mg into the skin once a week.     rosuvastatin (CRESTOR) 20 MG tablet TAKE 1 TABLET BY MOUTH EVERY DAY 30 tablet 2   No current facility-administered  medications for this visit.    Allergies  Sulfamethoxazole-trimethoprim and Sulfa antibiotics  Electrocardiogram:   10/02/2023 SR rate 75 LAE otherwise normal   Assessment and Plan  CAD/CABG:  07/27/13 CABG with LIMA to LAD, SVG to D1 and SVG RCA.  Non ischemic myovue  2018 and more recently 03/16/20  Continue asa and beta blocker  Been over 3 years and some symptoms Update Ex myovue study  Chol:  Vascepa  added to crestor and zetia LDL 34 in February with primary   Thyroid:  Stable on replacement  Labs with Dr Romero Belling dose recently decreased   DM:  Discussed low carb diet.  Target hemoglobin A1c is 6.5 or less.  Continue current medications.  GERD:  Improved continue prilosec  Anxiety:  F/u therapist  Ok to use HPTA and other natural remedies  On Buspar now   Ortho:  Post  bilateral TKR;s with improved mobility  Ex Myovue  F/U in a year     [employer]

## 2023-09-28 ENCOUNTER — Other Ambulatory Visit: Payer: Self-pay | Admitting: Cardiovascular Disease

## 2023-10-02 ENCOUNTER — Encounter: Payer: Self-pay | Admitting: Cardiovascular Disease

## 2023-10-02 ENCOUNTER — Ambulatory Visit: Payer: Medicare Other | Attending: Cardiovascular Disease | Admitting: Cardiovascular Disease

## 2023-10-02 ENCOUNTER — Other Ambulatory Visit: Payer: Self-pay

## 2023-10-02 ENCOUNTER — Encounter (HOSPITAL_COMMUNITY): Payer: Self-pay

## 2023-10-02 VITALS — BP 118/74 | HR 74 | Resp 16 | Ht 68.0 in | Wt 172.2 lb

## 2023-10-02 DIAGNOSIS — E782 Mixed hyperlipidemia: Secondary | ICD-10-CM | POA: Diagnosis not present

## 2023-10-02 DIAGNOSIS — Z951 Presence of aortocoronary bypass graft: Secondary | ICD-10-CM

## 2023-10-02 DIAGNOSIS — R072 Precordial pain: Secondary | ICD-10-CM | POA: Diagnosis not present

## 2023-10-02 DIAGNOSIS — R0600 Dyspnea, unspecified: Secondary | ICD-10-CM

## 2023-10-02 DIAGNOSIS — R079 Chest pain, unspecified: Secondary | ICD-10-CM

## 2023-10-02 MED ORDER — NITROGLYCERIN 0.4 MG SL SUBL: SUBLINGUAL_TABLET | SUBLINGUAL | 3 refills | Status: AC

## 2023-10-02 NOTE — Patient Instructions (Addendum)
Medication Instructions:  Your physician recommends that you continue on your current medications as directed. Please refer to the Current Medication list given to you today.  *If you need a refill on your cardiac medications before your next appointment, please call your pharmacy*  Lab Work: If you have labs (blood work) drawn today and your tests are completely normal, you will receive your results only by: MyChart Message (if you have MyChart) OR A paper copy in the mail If you have any lab test that is abnormal or we need to change your treatment, we will call you to review the results.  Testing/Procedures: Your physician has requested that you have en exercise stress myoview. For further information please visit [email or web address] Please follow instruction sheet, as given.  Follow-Up: At [facility], you and your health needs are our priority.  As part of our continuing mission to provide you with exceptional heart care, we have created designated Provider Care Teams.  These Care Teams include your primary Cardiologist (physician) and Advanced Practice Providers (APPs -  Physician Assistants and Nurse Practitioners) who all work together to provide you with the care you need, when you need it.  We recommend signing up for the patient portal called "MyChart".  Sign up information is provided on this After Visit Summary.  MyChart is used to connect with patients for Virtual Visits (Telemedicine).  Patients are able to view lab/test results, encounter notes, upcoming appointments, etc.  Non-urgent messages can be sent to your provider as well.   To learn more about what you can do with MyChart, go to [email or web address].    Your next appointment:   1 year(s)  Provider:   Charlton Haws, MD

## 2023-10-06 ENCOUNTER — Ambulatory Visit (HOSPITAL_COMMUNITY): Payer: Medicare Other | Attending: Cardiovascular Disease

## 2023-10-06 DIAGNOSIS — R072 Precordial pain: Secondary | ICD-10-CM | POA: Diagnosis not present

## 2023-10-06 LAB — MYOCARDIAL PERFUSION IMAGING
Angina Index: 0
Estimated workload: 4.6
Exercise duration (min): 3 min
Exercise duration (sec): 4 s
LV dias vol: 52 mL (ref 46–106)
LV sys vol: 14 mL
MPHR: 152 {beats}/min
Nuc Stress EF: 72 %
Peak HR: 153 {beats}/min
Percent HR: 100 %
Rest HR: 79 {beats}/min
Rest Nuclear Isotope Dose: 10.6 mCi
SDS: 0
SRS: 4
SSS: 4
Stress Nuclear Isotope Dose: 30.1 mCi
TID: 1

## 2023-10-06 MED ORDER — TECHNETIUM TC 99M TETROFOSMIN IV KIT
10.6000 | PACK | Freq: Once | INTRAVENOUS | Status: AC | PRN
  Administered 2023-10-06: 10.6 via INTRAVENOUS

## 2023-10-06 MED ORDER — TECHNETIUM TC 99M TETROFOSMIN IV KIT
30.1000 | PACK | Freq: Once | INTRAVENOUS | Status: AC | PRN
  Administered 2023-10-06: 30.1 via INTRAVENOUS

## 2023-10-11 ENCOUNTER — Other Ambulatory Visit: Payer: Self-pay | Admitting: Cardiovascular Disease

## 2023-10-13 DIAGNOSIS — F320 Major depressive disorder, single episode, mild: Secondary | ICD-10-CM | POA: Diagnosis not present

## 2023-10-24 ENCOUNTER — Other Ambulatory Visit: Payer: Self-pay | Admitting: Cardiovascular Disease

## 2023-10-26 ENCOUNTER — Other Ambulatory Visit: Payer: Self-pay | Admitting: Cardiovascular Disease

## 2023-10-29 DIAGNOSIS — R509 Fever, unspecified: Secondary | ICD-10-CM | POA: Diagnosis not present

## 2023-10-29 DIAGNOSIS — R0981 Nasal congestion: Secondary | ICD-10-CM | POA: Diagnosis not present

## 2023-10-29 DIAGNOSIS — R52 Pain, unspecified: Secondary | ICD-10-CM | POA: Diagnosis not present

## 2023-10-29 DIAGNOSIS — R051 Acute cough: Secondary | ICD-10-CM | POA: Diagnosis not present

## 2023-11-24 DIAGNOSIS — F320 Major depressive disorder, single episode, mild: Secondary | ICD-10-CM | POA: Diagnosis not present

## 2023-12-22 DIAGNOSIS — F320 Major depressive disorder, single episode, mild: Secondary | ICD-10-CM | POA: Diagnosis not present

## 2024-01-19 DIAGNOSIS — F320 Major depressive disorder, single episode, mild: Secondary | ICD-10-CM | POA: Diagnosis not present

## 2024-01-29 DIAGNOSIS — I10 Essential (primary) hypertension: Secondary | ICD-10-CM | POA: Diagnosis not present

## 2024-01-29 DIAGNOSIS — E113291 Type 2 diabetes mellitus with mild nonproliferative diabetic retinopathy without macular edema, right eye: Secondary | ICD-10-CM | POA: Diagnosis not present

## 2024-01-29 DIAGNOSIS — E559 Vitamin D deficiency, unspecified: Secondary | ICD-10-CM | POA: Diagnosis not present

## 2024-01-29 DIAGNOSIS — Z0000 Encounter for general adult medical examination without abnormal findings: Secondary | ICD-10-CM | POA: Diagnosis not present

## 2024-01-29 DIAGNOSIS — E782 Mixed hyperlipidemia: Secondary | ICD-10-CM | POA: Diagnosis not present

## 2024-02-02 ENCOUNTER — Other Ambulatory Visit: Payer: Self-pay | Admitting: Family Medicine

## 2024-02-02 DIAGNOSIS — E2839 Other primary ovarian failure: Secondary | ICD-10-CM

## 2024-02-03 ENCOUNTER — Other Ambulatory Visit: Payer: Self-pay | Admitting: Family Medicine

## 2024-02-03 DIAGNOSIS — Z1231 Encounter for screening mammogram for malignant neoplasm of breast: Secondary | ICD-10-CM

## 2024-02-09 DIAGNOSIS — E890 Postprocedural hypothyroidism: Secondary | ICD-10-CM | POA: Diagnosis not present

## 2024-02-09 DIAGNOSIS — E1165 Type 2 diabetes mellitus with hyperglycemia: Secondary | ICD-10-CM | POA: Diagnosis not present

## 2024-02-17 DIAGNOSIS — F320 Major depressive disorder, single episode, mild: Secondary | ICD-10-CM | POA: Diagnosis not present

## 2024-02-18 DIAGNOSIS — C73 Malignant neoplasm of thyroid gland: Secondary | ICD-10-CM | POA: Diagnosis not present

## 2024-02-18 DIAGNOSIS — E1165 Type 2 diabetes mellitus with hyperglycemia: Secondary | ICD-10-CM | POA: Diagnosis not present

## 2024-02-18 DIAGNOSIS — E7800 Pure hypercholesterolemia, unspecified: Secondary | ICD-10-CM | POA: Diagnosis not present

## 2024-02-18 DIAGNOSIS — E890 Postprocedural hypothyroidism: Secondary | ICD-10-CM | POA: Diagnosis not present

## 2024-02-23 ENCOUNTER — Telehealth: Payer: Self-pay

## 2024-02-23 ENCOUNTER — Other Ambulatory Visit: Payer: Self-pay | Admitting: Cardiovascular Disease

## 2024-02-23 DIAGNOSIS — I2510 Atherosclerotic heart disease of native coronary artery without angina pectoris: Secondary | ICD-10-CM | POA: Diagnosis not present

## 2024-02-23 DIAGNOSIS — Z1211 Encounter for screening for malignant neoplasm of colon: Secondary | ICD-10-CM | POA: Diagnosis not present

## 2024-02-23 NOTE — Telephone Encounter (Signed)
   Pre-operative Risk Assessment    Patient Name: Natalie Hensley  DOB: 07-31-1956 MRN: [id number]   Date of last office visit: 10/01/24 Date of next office visit: n/a   Request for Surgical Clearance    Procedure:   Colon screening   Date of Surgery:  Clearance 03/29/24                                 Surgeon:  Dr. Genell Ken  Surgeon's Group or Practice Name:  [facility]  Phone number:  [phone]  Fax number:  [phone]   Type of Clearance Requested:   - Medical  - Pharmacy:  Hold Aspirin Not indicated    Type of Anesthesia:   propofol    Additional requests/questions:    Mansfield Seip   02/23/2024, 4:27 PM

## 2024-02-23 NOTE — Telephone Encounter (Signed)
   Name: Natalie Hensley  DOB: 01-Jan-1956  MRN: [id number]  Primary Cardiologist: Janelle Mediate, MD   Preoperative team, please contact this patient and set up a phone call appointment for further preoperative risk assessment. Please obtain consent and complete medication review. Thank you for your help.  I confirm that guidance regarding antiplatelet and oral anticoagulation therapy has been completed and, if necessary, noted below.  Regarding ASA therapy, we recommend continuation of ASA throughout the perioperative period.    I also confirmed the patient resides in the state of [place] . As per [facility] Medical Board telemedicine laws, the patient must reside in the state in which the provider is licensed.   Ava Boatman, NP 02/23/2024, 4:47 PM [facility]

## 2024-02-24 ENCOUNTER — Telehealth: Payer: Self-pay

## 2024-02-24 NOTE — Telephone Encounter (Signed)
 Preop televisit now scheduled, med rec and consent done.

## 2024-02-24 NOTE — Telephone Encounter (Signed)
  Patient Consent for Virtual Visit        Natalie Hensley has provided verbal consent on 02/24/2024 for a virtual visit (video or telephone).   CONSENT FOR VIRTUAL VISIT FOR:  Natalie Hensley  By participating in this virtual visit I agree to the following:  I hereby voluntarily request, consent and authorize [facility] and its employed or contracted physicians, physician assistants, nurse practitioners or other licensed health care professionals (the Practitioner), to provide me with telemedicine health care services (the "Services") as deemed necessary by the treating Practitioner. I acknowledge and consent to receive the Services by the Practitioner via telemedicine. I understand that the telemedicine visit will involve communicating with the Practitioner through live audiovisual communication technology and the disclosure of certain medical information by electronic transmission. I acknowledge that I have been given the opportunity to request an in-person assessment or other available alternative prior to the telemedicine visit and am voluntarily participating in the telemedicine visit.  I understand that I have the right to withhold or withdraw my consent to the use of telemedicine in the course of my care at any time, without affecting my right to future care or treatment, and that the Practitioner or I may terminate the telemedicine visit at any time. I understand that I have the right to inspect all information obtained and/or recorded in the course of the telemedicine visit and may receive copies of available information for a reasonable fee.  I understand that some of the potential risks of receiving the Services via telemedicine include:  Delay or interruption in medical evaluation due to technological equipment failure or disruption; Information transmitted may not be sufficient (e.g. poor resolution of images) to allow for appropriate medical decision making by the Practitioner;  and/or  In rare instances, security protocols could fail, causing a breach of personal health information.  Furthermore, I acknowledge that it is my responsibility to provide information about my medical history, conditions and care that is complete and accurate to the best of my ability. I acknowledge that Practitioner's advice, recommendations, and/or decision may be based on factors not within their control, such as incomplete or inaccurate data provided by me or distortions of diagnostic images or specimens that may result from electronic transmissions. I understand that the practice of medicine is not an exact science and that Practitioner makes no warranties or guarantees regarding treatment outcomes. I acknowledge that a copy of this consent can be made available to me via my patient portal [facility] MyChart), or I can request a printed copy by calling the office of [facility].    I understand that my insurance will be billed for this visit.   I have read or had this consent read to me. I understand the contents of this consent, which adequately explains the benefits and risks of the Services being provided via telemedicine.  I have been provided ample opportunity to ask questions regarding this consent and the Services and have had my questions answered to my satisfaction. I give my informed consent for the services to be provided through the use of telemedicine in my medical care

## 2024-02-25 ENCOUNTER — Ambulatory Visit
Admission: RE | Admit: 2024-02-25 | Discharge: 2024-02-25 | Disposition: A | Discharge status: Home / Self Care | Source: Ambulatory Visit | Attending: Family Medicine | Admitting: Family Medicine

## 2024-02-25 DIAGNOSIS — Z1231 Encounter for screening mammogram for malignant neoplasm of breast: Secondary | ICD-10-CM

## 2024-03-17 ENCOUNTER — Ambulatory Visit: Attending: Internal Medicine | Admitting: Student

## 2024-03-17 DIAGNOSIS — Z01810 Encounter for preprocedural cardiovascular examination: Secondary | ICD-10-CM | POA: Diagnosis not present

## 2024-03-17 NOTE — Progress Notes (Signed)
 Virtual Visit via Telephone Note   Because of Natalie Hensley's co-morbid illnesses, she is at least at moderate risk for complications without adequate follow up.  This format is felt to be most appropriate for this patient at this time.  The patient did not have access to video technology/had technical difficulties with video requiring transitioning to audio format only (telephone).  All issues noted in this document were discussed and addressed.  No physical exam could be performed with this format.  Please refer to the patient's chart for her consent to telehealth for [facility].  Evaluation Performed:  Preoperative cardiovascular risk assessment _____________   Date:  03/17/2024   Patient ID:  Natalie Hensley, DOB Aug 31, 1956, MRN [id number] Patient Location:  Home Provider location:   Office  Primary Care Provider:  Sylvester Evert, MD Primary Cardiologist:  Janelle Mediate, MD  Chief Complaint / Patient Profile   68 y.o. y/o female with a h/o CAD s/p CABG x 13 July 2013 in the setting of MI, low risk Myoview  November 2024, hypertension, hyperlipidemia, thyroid  cancer, hypothyroidism, T2DM who is pending colonoscopy by Dr. Feliberto Hopping and presents today for telephonic preoperative cardiovascular risk assessment.  History of Present Illness    Natalie Hensley is a 68 y.o. female who presents via audio/video conferencing for a telehealth visit today.  Pt was last seen in cardiology clinic on 10/02/2023 by Dr. Stann Earnest.  At that time ALEXXIS FLEURIMOND was stable from a cardiac standpoint.  The patient is now pending procedure as outlined above. Since her last visit, she is doing well. Patient denies shortness of breath, dyspnea on exertion, lower extremity edema, orthopnea or PND. No chest pain, pressure, or tightness. No palpitations.  She stays active walking 30-35 minutes 5-6 days a week or using a stationary bicycle if she cannot get out to walk.   Past Medical History    Past Medical  History:  Diagnosis Date   Anxiety    Arthritis    bilateral knees, right hip   Carpal tunnel syndrome    Complication of anesthesia    Coronary artery disease    Depression    Family history of adverse reaction to anesthesia    SISTER HAS NAUSEA    GERD (gastroesophageal reflux disease)    HTN (hypertension)    Hyperlipidemia    Hypothyroidism    No thyroid    Myocardial infarction (HCC)    PONV (postoperative nausea and vomiting)    Thyroid  cancer (HCC)    Type II diabetes mellitus (HCC)    Urinary frequency    Past Surgical History:  Procedure Laterality Date   APPENDECTOMY     bone infection  Left    left shin surgery by Dr Ana Balling   COLONOSCOPY     CORONARY ARTERY BYPASS GRAFT  07/27/2013   JOINT REPLACEMENT     LAPAROSCOPIC APPENDECTOMY     LAPAROSCOPIC CHOLECYSTECTOMY  04/22/2007   Maximo Spar 04/22/2007   TOTAL ABDOMINAL HYSTERECTOMY     TOTAL KNEE ARTHROPLASTY Left 07/21/2017   Procedure: TOTAL KNEE ARTHROPLASTY;  Surgeon: Dayne Even, MD;  Location: [facility] OR;  Service: Orthopedics;  Laterality: Left;   TOTAL KNEE ARTHROPLASTY Right 08/31/2018   TOTAL KNEE ARTHROPLASTY Right 08/31/2018   Procedure: RIGHT TOTAL KNEE ARTHROPLASTY;  Surgeon: Dayne Even, MD;  Location: [facility] OR;  Service: Orthopedics;  Laterality: Right;    Allergies  Allergies  Allergen Reactions   Sulfamethoxazole-Trimethoprim Other (See Comments)   Sulfa Antibiotics Rash  Home Medications    Prior to Admission medications   Medication Sig Start Date End Date Taking? Authorizing Provider  ALPRAZolam  (XANAX ) 0.25 MG tablet Take 0.125 mg by mouth daily as needed for anxiety.     [provider]  aspirin  EC 81 MG tablet Take 1 tablet (81 mg total) by mouth daily. 03/05/20   Loyde Rule, MD  busPIRone  (BUSPAR ) 10 MG tablet Take 10 mg by mouth 2 (two) times daily.    [provider]  Cholecalciferol (VITAMIN D) 50 MCG (2000 UT) tablet Take 2,000 Units by mouth daily.     [provider]  ezetimibe  (ZETIA ) 10 MG tablet TAKE 1 TABLET BY MOUTH EVERY DAY 10/26/23   Nishan, Peter C, MD  icosapent  Ethyl (VASCEPA ) 1 g capsule TAKE 2 CAPSULES BY MOUTH TWICE A DAY 02/23/24   Loyde Rule, MD  levothyroxine  (SYNTHROID ) 112 MCG tablet Take 112 mcg by mouth every morning. 05/24/20   [provider]  metFORMIN  (GLUCOPHAGE -XR) 500 MG 24 hr tablet Take 500 mg by mouth daily. 03/18/22   [provider]  metoprolol  (LOPRESSOR ) 50 MG tablet Take 25 mg by mouth 2 (two) times daily.  06/25/15   [provider]  nitroGLYCERIN  (NITROSTAT ) 0.4 MG SL tablet Take 1 NTG, under your tongue, while sitting.  If no relief of pain may repeat NTG, one tab every 5 minutes up to 3 tablets total over 15 minutes.  If no relief CALL 911. 10/02/23   Loyde Rule, MD  omeprazole (PRILOSEC) 20 MG capsule Take 20 mg by mouth at bedtime.     [provider]  OZEMPIC, 0.25 OR 0.5 MG/DOSE, 2 MG/3ML SOPN Inject 0.5 mg into the skin once a week.    [provider]  rosuvastatin  (CRESTOR ) 20 MG tablet TAKE 1 TABLET BY MOUTH EVERY DAY 10/27/23   Loyde Rule, MD    Physical Exam    Vital Signs:  JERSI MANOCCHIO does not have vital signs available for review today.  Given telephonic nature of communication, physical exam is limited. AAOx3. NAD. Normal affect.  Speech and respirations are unlabored.  Accessory Clinical Findings    Cardiac Studies & Procedures   ______________________________________________________________________________________________   STRESS TESTS  MYOCARDIAL PERFUSION IMAGING 10/06/2023  Narrative   Findings are consistent with small area of anteroapical infarction. The study is low risk.   A Bruce protocol stress test was performed. Exercise capacity was moderately impaired. Patient exercised for 3 min and 4 sec. Maximum HR of 153 bpm. MPHR 100.0%. Peak METS 4.6. The patient experienced no angina during the test. The test  was stopped because the patient experienced fatigue. The patient reported dyspnea and leg pain during the stress test. Normal blood pressure and normal heart rate response noted during stress. Heart rate recovery was normal.   LV perfusion is abnormal. There is no evidence of ischemia. There is evidence of infarction. Defect 1: There is a small defect with severe reduction in uptake present in the apical anterior location(s) that is fixed. There is abnormal wall motion in the defect area. Consistent with infarction.   Left ventricular function is normal. Nuclear stress EF: 72%. The left ventricular ejection fraction is hyperdynamic (>65%). End diastolic cavity size is normal. End systolic cavity size is normal. No evidence of transient ischemic dilation (TID) noted.   Prior study available for comparison. No changes compared to prior study.            ______________________________________________________________________________________________  Assessment & Plan    Primary Cardiologist: Janelle Mediate, MD  Preoperative cardiovascular risk assessment.  Colonoscopy by Dr. Feliberto Hopping on 03/29/2024.  Chart reviewed as part of pre-operative protocol coverage. According to the RCRI, patient has a 0.9% risk of MACE. Patient reports activity equivalent to >4.0 METS (walking 30-35 minutes 5-6 days a week, stationary bicycle if unable to walk).   Given past medical history and time since last visit, based on ACC/AHA guidelines, Jaice Stain Steidinger would be at acceptable risk for the planned procedure without further cardiovascular testing.   Patient was advised that if she develops new symptoms prior to surgery to contact our office to arrange a follow-up appointment.  she verbalized understanding.  Ideally aspirin  should be continued without interruption, however if the bleeding risk is too great, aspirin  may be held for 5-7 days prior to surgery. Please resume aspirin  post operatively when it is felt to  be safe from a bleeding standpoint.    I will route this recommendation to the requesting party via Epic fax function.  Please call with questions.  Time:   Today, I have spent 5 minutes with the patient with telehealth technology discussing medical history, symptoms, and management plan.     Morey Ar, NP  03/17/2024, 8:11 AM

## 2024-03-22 DIAGNOSIS — F320 Major depressive disorder, single episode, mild: Secondary | ICD-10-CM | POA: Diagnosis not present

## 2024-03-29 DIAGNOSIS — D120 Benign neoplasm of cecum: Secondary | ICD-10-CM | POA: Diagnosis not present

## 2024-03-29 DIAGNOSIS — D123 Benign neoplasm of transverse colon: Secondary | ICD-10-CM | POA: Diagnosis not present

## 2024-03-29 DIAGNOSIS — Z1211 Encounter for screening for malignant neoplasm of colon: Secondary | ICD-10-CM | POA: Diagnosis not present

## 2024-03-29 DIAGNOSIS — D122 Benign neoplasm of ascending colon: Secondary | ICD-10-CM | POA: Diagnosis not present

## 2024-03-31 DIAGNOSIS — D123 Benign neoplasm of transverse colon: Secondary | ICD-10-CM | POA: Diagnosis not present

## 2024-03-31 DIAGNOSIS — D122 Benign neoplasm of ascending colon: Secondary | ICD-10-CM | POA: Diagnosis not present

## 2024-03-31 DIAGNOSIS — D120 Benign neoplasm of cecum: Secondary | ICD-10-CM | POA: Diagnosis not present

## 2024-04-20 DIAGNOSIS — F320 Major depressive disorder, single episode, mild: Secondary | ICD-10-CM | POA: Diagnosis not present

## 2024-05-17 DIAGNOSIS — F320 Major depressive disorder, single episode, mild: Secondary | ICD-10-CM | POA: Diagnosis not present

## 2024-06-15 DIAGNOSIS — F320 Major depressive disorder, single episode, mild: Secondary | ICD-10-CM | POA: Diagnosis not present

## 2024-06-21 DIAGNOSIS — E890 Postprocedural hypothyroidism: Secondary | ICD-10-CM | POA: Diagnosis not present

## 2024-06-21 DIAGNOSIS — E1165 Type 2 diabetes mellitus with hyperglycemia: Secondary | ICD-10-CM | POA: Diagnosis not present

## 2024-06-28 DIAGNOSIS — E7800 Pure hypercholesterolemia, unspecified: Secondary | ICD-10-CM | POA: Diagnosis not present

## 2024-06-28 DIAGNOSIS — C73 Malignant neoplasm of thyroid gland: Secondary | ICD-10-CM | POA: Diagnosis not present

## 2024-06-28 DIAGNOSIS — E890 Postprocedural hypothyroidism: Secondary | ICD-10-CM | POA: Diagnosis not present

## 2024-06-28 DIAGNOSIS — E1165 Type 2 diabetes mellitus with hyperglycemia: Secondary | ICD-10-CM | POA: Diagnosis not present

## 2024-07-07 DIAGNOSIS — R7989 Other specified abnormal findings of blood chemistry: Secondary | ICD-10-CM | POA: Diagnosis not present

## 2024-07-19 DIAGNOSIS — F320 Major depressive disorder, single episode, mild: Secondary | ICD-10-CM | POA: Diagnosis not present

## 2024-08-23 DIAGNOSIS — F320 Major depressive disorder, single episode, mild: Secondary | ICD-10-CM | POA: Diagnosis not present

## 2024-08-27 DIAGNOSIS — Z03818 Encounter for observation for suspected exposure to other biological agents ruled out: Secondary | ICD-10-CM | POA: Diagnosis not present

## 2024-08-27 DIAGNOSIS — J029 Acute pharyngitis, unspecified: Secondary | ICD-10-CM | POA: Diagnosis not present

## 2024-09-20 DIAGNOSIS — F320 Major depressive disorder, single episode, mild: Secondary | ICD-10-CM | POA: Diagnosis not present

## 2024-10-05 ENCOUNTER — Other Ambulatory Visit

## 2024-10-05 ENCOUNTER — Ambulatory Visit (HOSPITAL_BASED_OUTPATIENT_CLINIC_OR_DEPARTMENT_OTHER)
Admission: RE | Admit: 2024-10-05 | Discharge: 2024-10-05 | Disposition: A | Discharge status: Home / Self Care | Source: Ambulatory Visit | Attending: Family Medicine | Admitting: Family Medicine

## 2024-10-05 DIAGNOSIS — E2839 Other primary ovarian failure: Secondary | ICD-10-CM | POA: Diagnosis not present

## 2024-10-05 DIAGNOSIS — M8589 Other specified disorders of bone density and structure, multiple sites: Secondary | ICD-10-CM | POA: Diagnosis not present

## 2024-10-05 DIAGNOSIS — Z780 Asymptomatic menopausal state: Secondary | ICD-10-CM | POA: Diagnosis not present

## 2024-10-25 ENCOUNTER — Other Ambulatory Visit: Payer: Self-pay | Admitting: Cardiovascular Disease

## 2024-10-30 ENCOUNTER — Other Ambulatory Visit: Payer: Self-pay | Admitting: Cardiovascular Disease

## 2024-11-17 ENCOUNTER — Other Ambulatory Visit: Payer: Self-pay | Admitting: Cardiovascular Disease

## 2024-11-18 ENCOUNTER — Other Ambulatory Visit: Payer: Self-pay | Admitting: Cardiovascular Disease

## 2024-12-08 ENCOUNTER — Other Ambulatory Visit: Payer: Self-pay | Admitting: Cardiovascular Disease
